# Patient Record
Sex: Male | Born: 1975
Health system: Southern US, Community
[De-identification: ages and names within clinical notes are randomized; demographics above are authoritative.]

## PROBLEM LIST (undated history)

## (undated) DIAGNOSIS — B019 Varicella without complication: Secondary | ICD-10-CM

## (undated) DIAGNOSIS — R7989 Other specified abnormal findings of blood chemistry: Secondary | ICD-10-CM

## (undated) HISTORY — DX: Varicella without complication: B01.9

## (undated) HISTORY — DX: Other specified abnormal findings of blood chemistry: R79.89

---

## 2008-01-08 ENCOUNTER — Ambulatory Visit: Payer: Self-pay | Admitting: Internal Medicine

## 2008-01-08 DIAGNOSIS — Z91013 Allergy to seafood: Secondary | ICD-10-CM

## 2008-01-08 DIAGNOSIS — J309 Allergic rhinitis, unspecified: Secondary | ICD-10-CM | POA: Insufficient documentation

## 2008-01-08 LAB — CONVERTED CEMR LAB
ALT: 25 units/L (ref 0–53)
AST: 30 units/L (ref 0–37)
Albumin: 4.5 g/dL (ref 3.5–5.2)
Alkaline Phosphatase: 37 units/L — ABNORMAL LOW (ref 39–117)
BUN: 9 mg/dL (ref 6–23)
Bacteria, UA: NEGATIVE
Basophils Relative: 3.7 % — ABNORMAL HIGH (ref 0.0–3.0)
CO2: 30 meq/L (ref 19–32)
Chloride: 105 meq/L (ref 96–112)
Crystals: NEGATIVE
Eosinophils Absolute: 0.1 10*3/uL (ref 0.0–0.7)
Eosinophils Relative: 4.1 % (ref 0.0–5.0)
GFR calc Af Amer: 89 mL/min
Glucose, Bld: 71 mg/dL (ref 70–99)
HCT: 45 % (ref 39.0–52.0)
Ketones, ur: NEGATIVE mg/dL
MCV: 94.6 fL (ref 78.0–100.0)
Monocytes Absolute: 0.3 10*3/uL (ref 0.1–1.0)
Potassium: 4.4 meq/L (ref 3.5–5.1)
RBC / HPF: NONE SEEN
RBC: 4.76 M/uL (ref 4.22–5.81)
Specific Gravity, Urine: 1.02 (ref 1.000–1.03)
Total Protein: 7.4 g/dL (ref 6.0–8.3)
Urine Glucose: NEGATIVE mg/dL
Urobilinogen, UA: 0.2 (ref 0.0–1.0)
WBC: 3.1 10*3/uL — ABNORMAL LOW (ref 4.5–10.5)

## 2008-01-09 ENCOUNTER — Encounter: Payer: Self-pay | Admitting: Internal Medicine

## 2008-03-18 ENCOUNTER — Ambulatory Visit: Payer: Self-pay | Admitting: Internal Medicine

## 2008-03-18 DIAGNOSIS — J018 Other acute sinusitis: Secondary | ICD-10-CM

## 2008-12-07 ENCOUNTER — Ambulatory Visit: Payer: Self-pay | Admitting: Internal Medicine

## 2008-12-07 DIAGNOSIS — N529 Male erectile dysfunction, unspecified: Secondary | ICD-10-CM | POA: Insufficient documentation

## 2008-12-07 LAB — CONVERTED CEMR LAB
ALT: 26 units/L (ref 0–53)
AST: 26 units/L (ref 0–37)
Basophils Absolute: 0.1 10*3/uL (ref 0.0–0.1)
Basophils Relative: 2 % — ABNORMAL HIGH (ref 0–1)
Bilirubin, Direct: 0.1 mg/dL (ref 0.0–0.3)
Calcium: 9.7 mg/dL (ref 8.4–10.5)
Cholesterol: 193 mg/dL (ref 0–200)
Indirect Bilirubin: 0.6 mg/dL (ref 0.0–0.9)
Lymphocytes Relative: 51 % — ABNORMAL HIGH (ref 12–46)
MCHC: 34.5 g/dL (ref 30.0–36.0)
Neutro Abs: 1.4 10*3/uL — ABNORMAL LOW (ref 1.7–7.7)
Neutrophils Relative %: 33 % — ABNORMAL LOW (ref 43–77)
Platelets: 257 10*3/uL (ref 150–400)
Potassium: 4.3 meq/L (ref 3.5–5.3)
RDW: 13.4 % (ref 11.5–15.5)
Sodium: 141 meq/L (ref 135–145)
TSH: 1.316 microintl units/mL (ref 0.350–4.500)
Testosterone: 365.94 ng/dL (ref 350–890)
Total CHOL/HDL Ratio: 3.7
Total Protein: 7.3 g/dL (ref 6.0–8.3)
Triglycerides: 139 mg/dL (ref <150)
VLDL: 28 mg/dL (ref 0–40)

## 2008-12-08 ENCOUNTER — Telehealth: Payer: Self-pay | Admitting: Internal Medicine

## 2008-12-17 ENCOUNTER — Encounter: Payer: Self-pay | Admitting: Internal Medicine

## 2009-05-06 ENCOUNTER — Ambulatory Visit: Payer: Self-pay | Admitting: Internal Medicine

## 2009-05-06 DIAGNOSIS — E291 Testicular hypofunction: Secondary | ICD-10-CM | POA: Insufficient documentation

## 2009-05-06 LAB — CONVERTED CEMR LAB
Basophils Absolute: 0 10*3/uL (ref 0.0–0.1)
Basophils Relative: 1 % (ref 0–1)
Free T4: 1.08 ng/dL (ref 0.80–1.80)
Hemoglobin: 15.7 g/dL (ref 13.0–17.0)
Lymphocytes Relative: 53 % — ABNORMAL HIGH (ref 12–46)
MCHC: 33.8 g/dL (ref 30.0–36.0)
Neutro Abs: 1 10*3/uL — ABNORMAL LOW (ref 1.7–7.7)
Neutrophils Relative %: 34 % — ABNORMAL LOW (ref 43–77)
Prolactin: 4.4 ng/mL (ref 2.1–17.1)
RDW: 13.2 % (ref 11.5–15.5)
Saturation Ratios: 38 % (ref 20–55)
Sex Hormone Binding: 22 nmol/L (ref 13–71)
TIBC: 352 ug/dL (ref 215–435)
TSH: 0.992 microintl units/mL (ref 0.350–4.500)
Testosterone Free: 99.9 pg/mL (ref 47.0–244.0)
Testosterone-% Free: 2.5 % (ref 1.6–2.9)

## 2009-05-11 ENCOUNTER — Encounter (INDEPENDENT_AMBULATORY_CARE_PROVIDER_SITE_OTHER): Payer: Self-pay | Admitting: *Deleted

## 2009-06-16 ENCOUNTER — Encounter: Payer: Self-pay | Admitting: Internal Medicine

## 2010-03-08 ENCOUNTER — Ambulatory Visit: Payer: Self-pay | Admitting: Internal Medicine

## 2010-03-08 DIAGNOSIS — D72819 Decreased white blood cell count, unspecified: Secondary | ICD-10-CM | POA: Insufficient documentation

## 2010-03-08 DIAGNOSIS — S335XXA Sprain of ligaments of lumbar spine, initial encounter: Secondary | ICD-10-CM

## 2010-03-08 LAB — CONVERTED CEMR LAB
Basophils Absolute: 0 10*3/uL (ref 0.0–0.1)
Eosinophils Absolute: 0.1 10*3/uL (ref 0.0–0.7)
Eosinophils Relative: 3 % (ref 0–5)
HCT: 47.3 % (ref 39.0–52.0)
HIV: NONREACTIVE
Hemoglobin: 16.2 g/dL (ref 13.0–17.0)
Lymphocytes Relative: 50 % — ABNORMAL HIGH (ref 12–46)
MCHC: 34.2 g/dL (ref 30.0–36.0)
MCV: 93.5 fL (ref 78.0–100.0)
Monocytes Absolute: 0.4 10*3/uL (ref 0.1–1.0)
Platelets: 281 10*3/uL (ref 150–400)
RDW: 13.8 % (ref 11.5–15.5)

## 2010-03-09 ENCOUNTER — Telehealth: Payer: Self-pay | Admitting: Internal Medicine

## 2010-05-30 NOTE — Letter (Signed)
Summary: Primary Care Consult Scheduled Letter  [place] [facility]  [address]   [place], [place] [address]   Phone: [phone]  Fax: [phone]      05/11/2009 MRN: [id number]  Ray Lopez [address] [place], [place]  [address]    Dear [name],      We have scheduled an appointment for you.  At the recommendation of Dr.Yoo, we have scheduled you a consult with Dr Talmage Nap , [facility] on February 17,2011  at 1:30pm .  Their address is [address] , [address] The office phone number is [phone].  If this appointment day and time is not convenient for you, please feel free to call the office of the doctor you are being referred to at the number listed above and reschedule the appointment.     It is important for you to keep your scheduled appointments. We are here to make sure you are given good patient care. If you have questions or you have made changes to your appointment, please notify us at  [phone], ask for [facility].    Thank you,  Patient Care Coordinator [place] at [facility]

## 2010-05-30 NOTE — Consult Note (Signed)
Summary: [facility]   Imported By: Lanelle Bal 07/19/2009 08:48:52  _____________________________________________________________________  External Attachment:    Type:   Image     Comment:   External Document

## 2010-05-30 NOTE — Assessment & Plan Note (Signed)
Summary: CPX/HEA   Vital Signs:  Patient profile:   35 year old male Height:      69 inches Weight:      199.75 pounds BMI:     29.60 O2 Sat:      99 % on Room air Temp:     98.3 degrees F oral Pulse rate:   65 / minute Pulse rhythm:   regular Resp:     18 per minute BP sitting:   108 / 80  (right arm) Cuff size:   large  Vitals Entered By: Glendell Docker CMA (March 08, 2010 8:43 AM)  O2 Flow:  Room air CC: CPX Is Patient Diabetic? No Pain Assessment Patient in pain? no      Comments CPX, fasting for blood work, refill on Valtrex to [facility]   Primary Care Provider:  D. Thomos Lemons DO  CC:  CPX.  History of Present Illness: 35 y/o male for routine cpx lost wt exercising more several weeks ago - rode in bicycle event - 50 miles ever since then right low back is stiff and tight no radiation  no weakness  hypogonadism using androgel feeling better libido and ED better  Preventive Screening-Counseling & Management  Alcohol-Tobacco     Alcohol drinks/day: 1     Smoking Status: never  Caffeine-Diet-Exercise     Caffeine use/day: 2 beverages daily     Does Patient Exercise: yes     Times/week: <3  Allergies: 1)  ! * Shell Fish  Past History:  Past Medical History: Genital Herpes (diagnosed 10 yrs ago) Allergic Rhinitis Remote hx of asthma     Hx of elevated BP readings w/o dx of Htn     Social History: Occupation:  Production designer, theatre/television/film for [employer]  Married - 12 yrs 44 old daughter Never Smoked Alcohol use-yes (social)      Caffeine use/day:  2 beverages daily  Review of Systems  The patient denies anorexia, weight gain, chest pain, syncope, dyspnea on exertion, prolonged cough, abdominal pain, melena, hematochezia, severe indigestion/heartburn, and depression.    Physical Exam  General:  alert, well-developed, and well-nourished.   Head:  normocephalic and atraumatic.   Eyes:  pupils equal, pupils round, and pupils reactive to light.  mild  conjunctival injection Mouth:  good dentition and pharynx pink and moist.   Neck:  supple, no masses, and no carotid bruits.   Lungs:  normal respiratory effort and normal breath sounds.   Heart:  normal rate, regular rhythm, and no gallop.   Abdomen:  soft, non-tender, normal bowel sounds, no masses, and no inguinal hernia.   Extremities:  No lower extremity edema Neurologic:  cranial nerves II-XII intact and gait normal.   Skin:  turgor normal and color normal.   Psych:  normally interactive, good eye contact, not anxious appearing, and not depressed appearing.     Impression & Recommendations:  Problem # 1:  HEALTH MAINTENANCE EXAM (ICD-V70.0) Reviewed adult health maintenance protocols.  Td Booster: given (01/15/2006)   Flu Vax: Declined (03/08/2010)   Chol: 193 (12/07/2008)   HDL: 52 (12/07/2008)   LDL: 113 (12/07/2008)   TG: 139 (12/07/2008) TSH: 0.992 (05/06/2009)     Problem # 2:  HYPOGONADISM (ICD-257.2) Assessment: Improved  Problem # 3:  LEUKOPENIA, MILD (ICD-288.50)  Orders: T-CBC w/Diff ([id number]) T-Hepatitis B Surface Antibody ([id number]) T-Hepatitis B Surface Antigen (44010-27253) T-Hepatitis C Antibody (66440-34742) T-HIV Antibody  (Reflex) ([id number])  Problem # 4:  LUMBAR STRAIN (ICD-847.2) use muscle  relaxers as directed Patient advised to call office if symptoms persist or worsen.  Complete Medication List: 1)  Valacyclovir Hcl 1 Gm Tabs (Valacyclovir hcl) .... One by mouth qd 2)  Fluticasone Propionate 50 Mcg/act Susp (Fluticasone propionate) .... 2 sprays each nostril qd 3)  Epipen 0.3 Mg/0.28ml (1:1000) Devi (Epinephrine hcl (anaphylaxis)) .... Use as directed 4)  Proair Hfa 108 (90 Base) Mcg/act Aers (Albuterol sulfate) .... 2 puffs q 6 hrs as needed 5)  Fish Oil Oil (Fish oil) .... 3 capsules by mouth once daily 6)  Androgel Pump 1.25 Gm/act (1%) Gel (Testosterone) .... Apply four times a day to skin as directed 7)  Cyclobenzaprine Hcl 5 Mg  Tabs (Cyclobenzaprine hcl) .... One by mouth two times a day prn  Patient Instructions: 1)  Please schedule a follow-up appointment in 1 year. 2)  Return office visit needed earlier if back pain does not improve Prescriptions: CYCLOBENZAPRINE HCL 5 MG TABS (CYCLOBENZAPRINE HCL) one by mouth two times a day prn  #30 x 0   Entered and Authorized by:   D. Thomos Lemons DO   Signed by:   D. Thomos Lemons DO on 03/08/2010   Method used:   Electronically to        [facility] (retail)       [address]       [place], [place]  [address]       Ph: [phone]       Fax: [phone]   RxID:   [id number]    Orders Added: 1)  T-CBC w/Diff [[id number]] 2)  T-Hepatitis B Surface Antibody [[id number]] 3)  T-Hepatitis B Surface Antigen [63875-64332] 4)  T-Hepatitis C Antibody [95188-41660] 5)  T-HIV Antibody  (Reflex) [[id number]] 6)  Est. Patient age 61-39 [63] 7)  Est. Patient Level II [99212]   Immunization History:  Influenza Immunization History:    Influenza:  declined (03/08/2010)   Contraindications/Deferment of Procedures/Staging:    Test/Procedure: FLU VAX    Reason for deferment: patient declined   Immunization History:  Influenza Immunization History:    Influenza:  Declined (03/08/2010)  Current Allergies (reviewed today): ! * SHELL FISH

## 2010-05-30 NOTE — Progress Notes (Signed)
Summary: Lab Results  Phone Note Outgoing Call   Summary of Call: call pt - HIV test negative. CBC stable. chronic  infectious hepatitis blood tests - negative Initial call taken by: D. Thomos Lemons DO,  March 09, 2010 9:05 AM  Follow-up for Phone Call        call  placed to patient at [phone], he has been advised per Dr Artist Pais instructions Follow-up by: Glendell Docker CMA,  March 09, 2010 10:39 AM

## 2010-05-30 NOTE — Assessment & Plan Note (Signed)
Summary: 4 month follow up/mhf   Vital Signs:  Patient profile:   35 year old male Weight:      217.25 pounds BMI:     32.20 O2 Sat:      100 % on Room air Temp:     98.2 degrees F oral Pulse rate:   60 / minute Pulse rhythm:   regular Resp:     16 per minute BP sitting:   110 / 70  (left arm) Cuff size:   large  Vitals Entered By: Glendell Docker CMA (May 06, 2009 9:22 AM)  O2 Flow:  Room air  Primary Care Provider:  D. Thomos Lemons DO  CC:  4 Month Follow up .  History of Present Illness: 4 Month Follow up   35 y/o AA male for follow up.   He is still having issues with decreased libido and poor erection quality.  prev labs showed low normal testosterone.   no hx testicular trauma or infection.  no fam hx of iron overload.  no OTC meds.  no headache   Preventive Screening-Counseling & Management  Alcohol-Tobacco     Smoking Status: never  Allergies: 1)  ! * Shell Fish  Past History:  Past Medical History: Genital Herpes (diagnosed 10 yrs ago) Allergic Rhinitis Remote hx of asthma    Hx of elevated BP readings w/o dx of Htn    Family History: CAD - no Stoke - no DM - MGM Htn - M Hyperlipidemia - no Breast Ca - no Colon Ca - no Prostate Ca - no      Social History: Occupation:  Production designer, theatre/television/film for [employer]  Married Never Smoked Alcohol use-yes (social)       Physical Exam  General:  alert, well-developed, and well-nourished.   Lungs:  normal respiratory effort and normal breath sounds.   Heart:  normal rate, regular rhythm, and no gallop.   Abdomen:  soft, non-tender, normal bowel sounds, no masses, and no inguinal hernia.   Genitalia:  circumcised, no scrotal masses, no testicular masses or atrophy, and no cutaneous lesions.     Impression & Recommendations:  Problem # 1:  HYPOGONADISM (ICD-257.2) Otherwise healthy 35 y/o AA male c/o decreased libido and poor erection quality.  prev testosterone level was low normal.  refer to endo for  further eval.  Orders: T-Iron [phone]) T-Iron Binding Capacity (TIBC) ([address]) T-Ferritin (82956-21308) T-HIV Antibody  (Reflex) [id number]) T-TSH (52841-32440) T-T4, Free [phone]) T- * Misc. Laboratory test [phone]) T- * Misc. Laboratory test [phone]) T-CBC w/Diff [id number]) Endocrinology Referral (Endocrine)  Complete Medication List: 1)  Valacyclovir Hcl 1 Gm Tabs (Valacyclovir hcl) .... One by mouth qd 2)  Fluticasone Propionate 50 Mcg/act Susp (Fluticasone propionate) .... 2 sprays each nostril qd 3)  Epipen 0.3 Mg/0.12ml (1:1000) Devi (Epinephrine hcl (anaphylaxis)) .... Use as directed 4)  Proair Hfa 108 (90 Base) Mcg/act Aers (Albuterol sulfate) .... 2 puffs q 6 hrs as needed 5)  Fish Oil Oil (Fish oil) .... 3 capsules by mouth once daily  Patient Instructions: 1)  We will contact you re:  referral to endocrinologist.  Current Allergies (reviewed today): ! * SHELL FISH   Immunization History:  Influenza Immunization History:    Influenza:  declined (05/06/2009)   Contraindications/Deferment of Procedures/Staging:    Test/Procedure: FLU VAX    Reason for deferment: patient declined

## 2010-10-29 HISTORY — PX: VASECTOMY: SHX75

## 2011-04-03 ENCOUNTER — Encounter: Payer: Self-pay | Admitting: Internal Medicine

## 2011-04-03 ENCOUNTER — Ambulatory Visit (INDEPENDENT_AMBULATORY_CARE_PROVIDER_SITE_OTHER): Payer: 59 | Admitting: Internal Medicine

## 2011-04-03 VITALS — BP 120/90 | HR 60 | Temp 98.4°F | Resp 16 | Ht 69.0 in | Wt 205.0 lb

## 2011-04-03 DIAGNOSIS — Z0000 Encounter for general adult medical examination without abnormal findings: Secondary | ICD-10-CM

## 2011-04-03 NOTE — Patient Instructions (Signed)
Please schedule cbc, chem7, lipid, lft, tsh, ua v70.0 prior to next visit

## 2011-04-03 NOTE — Progress Notes (Signed)
  Subjective:    Patient ID: Ray Lopez, male    DOB: 1976/01/05, 35 y.o.   MRN: [id number]  HPI Pt presents to clinic for annual exam. No complaints. Reviewed past h/o mild leukopenia without recurrent infxn. BP minimally elevated with no formal h/o HTN.   Past Medical History  Diagnosis Date  . Genital herpes   . Allergic rhinitis   . Asthma   . Blood pressure elevated without history of HTN    No past surgical history on file.  reports that he has never smoked. He has never used smokeless tobacco. He reports that he drinks alcohol. He reports that he does not use illicit drugs. family history includes Diabetes in his maternal grandmother; Hyperlipidemia in his mother; and Hypertension in his mother.  There is no history of Coronary artery disease, and Stroke, and Breast cancer, and Colon cancer, and Prostate cancer, . Not on File     Review of Systems see hpi     Objective:   Physical Exam  Nursing note and vitals reviewed. Constitutional: He appears well-developed and well-nourished. No distress.  HENT:  Head: Normocephalic and atraumatic.  Right Ear: Tympanic membrane, external ear and ear canal normal.  Left Ear: Tympanic membrane, external ear and ear canal normal.  Nose: Nose normal.  Mouth/Throat: Oropharynx is clear and moist. No oropharyngeal exudate.  Eyes: Conjunctivae and EOM are normal. Pupils are equal, round, and reactive to light. Right eye exhibits no discharge. Left eye exhibits no discharge. No scleral icterus.  Neck: Neck supple. Carotid bruit is not present. No mass and no thyromegaly present.  Cardiovascular: Normal rate, regular rhythm, normal heart sounds and intact distal pulses.  Exam reveals no gallop and no friction rub.   No murmur heard. Pulmonary/Chest: Effort normal and breath sounds normal. No respiratory distress. He has no wheezes. He has no rales.  Abdominal: Soft. Normal appearance and bowel sounds are normal. He exhibits no  distension and no mass. There is no hepatosplenomegaly. There is no tenderness. There is no rebound and no guarding.  Lymphadenopathy:    He has no cervical adenopathy.  Neurological: He is alert.  Skin: Skin is warm and dry. He is not diaphoretic.  Psychiatric: He has a normal mood and affect.          Assessment & Plan:

## 2011-04-04 LAB — BASIC METABOLIC PANEL
BUN: 12 mg/dL (ref 6–23)
Chloride: 103 mEq/L (ref 96–112)
Glucose, Bld: 85 mg/dL (ref 70–99)
Potassium: 4.4 mEq/L (ref 3.5–5.3)

## 2011-04-04 LAB — URINALYSIS, ROUTINE W REFLEX MICROSCOPIC
Hgb urine dipstick: NEGATIVE
Ketones, ur: NEGATIVE mg/dL
Nitrite: NEGATIVE
Protein, ur: NEGATIVE mg/dL
Specific Gravity, Urine: 1.011 (ref 1.005–1.030)
Urobilinogen, UA: 0.2 mg/dL (ref 0.0–1.0)

## 2011-04-04 LAB — HEPATIC FUNCTION PANEL
ALT: 26 U/L (ref 0–53)
Albumin: 4.7 g/dL (ref 3.5–5.2)
Bilirubin, Direct: 0.1 mg/dL (ref 0.0–0.3)
Indirect Bilirubin: 0.6 mg/dL (ref 0.0–0.9)
Total Protein: 7.3 g/dL (ref 6.0–8.3)

## 2011-04-04 LAB — CBC WITH DIFFERENTIAL/PLATELET
Basophils Relative: 1 % (ref 0–1)
Eosinophils Relative: 2 % (ref 0–5)
HCT: 45.1 % (ref 39.0–52.0)
Hemoglobin: 15 g/dL (ref 13.0–17.0)
Lymphocytes Relative: 47 % — ABNORMAL HIGH (ref 12–46)
MCHC: 33.3 g/dL (ref 30.0–36.0)
MCV: 93.2 fL (ref 78.0–100.0)
Monocytes Absolute: 0.3 10*3/uL (ref 0.1–1.0)
Monocytes Relative: 10 % (ref 3–12)
Neutro Abs: 1.4 10*3/uL — ABNORMAL LOW (ref 1.7–7.7)

## 2011-04-04 LAB — LIPID PANEL
LDL Cholesterol: 125 mg/dL — ABNORMAL HIGH (ref 0–99)
VLDL: 27 mg/dL (ref 0–40)

## 2011-04-04 LAB — TSH: TSH: 1.419 u[IU]/mL (ref 0.350–4.500)

## 2011-04-07 DIAGNOSIS — Z0000 Encounter for general adult medical examination without abnormal findings: Secondary | ICD-10-CM | POA: Insufficient documentation

## 2011-04-07 NOTE — Assessment & Plan Note (Signed)
Nl exam. Obtain cpe screening labs. Recommend outpt bp log to be reviewed

## 2011-07-30 HISTORY — PX: REFRACTIVE SURGERY: SHX103

## 2012-04-07 ENCOUNTER — Ambulatory Visit (INDEPENDENT_AMBULATORY_CARE_PROVIDER_SITE_OTHER): Payer: 59 | Admitting: Internal Medicine

## 2012-04-07 ENCOUNTER — Encounter: Payer: Self-pay | Admitting: Internal Medicine

## 2012-04-07 VITALS — BP 112/68 | HR 70 | Temp 98.4°F | Resp 12 | Ht 69.75 in | Wt 215.1 lb

## 2012-04-07 DIAGNOSIS — Z0000 Encounter for general adult medical examination without abnormal findings: Secondary | ICD-10-CM

## 2012-04-07 MED ORDER — EPINEPHRINE 0.3 MG/0.3ML IJ DEVI: 0.3000 mg | Freq: Once | INTRAMUSCULAR | Status: DC

## 2012-04-07 MED ORDER — ALBUTEROL SULFATE HFA 108 (90 BASE) MCG/ACT IN AERS: 2.0000 | INHALATION_SPRAY | Freq: Four times a day (QID) | RESPIRATORY_TRACT | Status: DC | PRN

## 2012-04-07 NOTE — Assessment & Plan Note (Signed)
Nl exam. Obtain cpe labs. 

## 2012-04-07 NOTE — Progress Notes (Signed)
  Subjective:    Patient ID: Ray Lopez, male    DOB: 07-13-1975, 36 y.o.   MRN: [id number]  HPI Pt presents to clinic for annual exam. No active complaints. Needs refill of epipen but has not had to use the medication recently. Declines influenza vaccine.  Past Medical History  Diagnosis Date  . Genital herpes   . Allergic rhinitis   . Asthma   . Blood pressure elevated without history of HTN    Past Surgical History  Procedure Date  . Vasectomy 10/2100  . Refractive surgery 4/13    lasix eye surgery    reports that he has never smoked. He has never used smokeless tobacco. He reports that he drinks alcohol. He reports that he does not use illicit drugs. family history includes Diabetes in his maternal grandmother; Hyperlipidemia in his mother; and Hypertension in his mother.  There is no history of Coronary artery disease, and Stroke, and Breast cancer, and Colon cancer, and Prostate cancer, . Not on File   Review of Systems see hpi     Objective:   Physical Exam  Physical Exam  Nursing note and vitals reviewed. Constitutional: He appears well-developed and well-nourished. No distress.  HENT:  Head: Normocephalic and atraumatic.  Right Ear: Tympanic membrane and external ear normal.  Left Ear: Tympanic membrane and external ear normal.  Nose: Nose normal.  Mouth/Throat: Uvula is midline, oropharynx is clear and moist and mucous membranes are normal. No oropharyngeal exudate.  Eyes: Conjunctivae and EOM are normal. Pupils are equal, round, and reactive to light. Right eye exhibits no discharge. Left eye exhibits no discharge. No scleral icterus.  Neck: Neck supple. Carotid bruit is not present. No thyromegaly present.  Cardiovascular: Normal rate, regular rhythm and normal heart sounds.  Exam reveals no gallop and no friction rub.   No murmur heard. Pulmonary/Chest: Effort normal and breath sounds normal. No respiratory distress. He has no wheezes. He has no rales.   Abdominal: Soft. He exhibits no distension and no mass. There is no hepatosplenomegaly. There is no tenderness. There is no rebound. Hernia confirmed negative in the right inguinal area and confirmed negative in the left inguinal area.  Lymphadenopathy:    He has no cervical adenopathy.   Neurological: He is alert.  Skin: Skin is warm and dry. He is not diaphoretic.  Psychiatric: He has a normal mood and affect.        Assessment & Plan:

## 2013-09-24 ENCOUNTER — Encounter: Payer: Self-pay | Admitting: Physician Assistant

## 2013-09-24 ENCOUNTER — Ambulatory Visit (INDEPENDENT_AMBULATORY_CARE_PROVIDER_SITE_OTHER): Payer: 59 | Admitting: Physician Assistant

## 2013-09-24 VITALS — BP 108/82 | HR 72 | Temp 98.3°F | Resp 16 | Ht 69.0 in | Wt 214.2 lb

## 2013-09-24 DIAGNOSIS — E291 Testicular hypofunction: Secondary | ICD-10-CM

## 2013-09-24 DIAGNOSIS — R7989 Other specified abnormal findings of blood chemistry: Secondary | ICD-10-CM

## 2013-09-24 DIAGNOSIS — N529 Male erectile dysfunction, unspecified: Secondary | ICD-10-CM

## 2013-09-24 DIAGNOSIS — Z0000 Encounter for general adult medical examination without abnormal findings: Secondary | ICD-10-CM

## 2013-09-24 LAB — CBC WITH DIFFERENTIAL/PLATELET
BASOS ABS: 0 10*3/uL (ref 0.0–0.1)
Basophils Relative: 1 % (ref 0–1)
Eosinophils Absolute: 0.1 10*3/uL (ref 0.0–0.7)
Eosinophils Relative: 3 % (ref 0–5)
HCT: 42.4 % (ref 39.0–52.0)
Hemoglobin: 14.7 g/dL (ref 13.0–17.0)
Lymphocytes Relative: 44 % (ref 12–46)
Lymphs Abs: 1.5 10*3/uL (ref 0.7–4.0)
MCH: 30.9 pg (ref 26.0–34.0)
MCHC: 34.7 g/dL (ref 30.0–36.0)
MCV: 89.1 fL (ref 78.0–100.0)
Monocytes Absolute: 0.3 10*3/uL (ref 0.1–1.0)
Monocytes Relative: 8 % (ref 3–12)
Neutro Abs: 1.5 10*3/uL — ABNORMAL LOW (ref 1.7–7.7)
Neutrophils Relative %: 44 % (ref 43–77)
PLATELETS: 255 10*3/uL (ref 150–400)
RBC: 4.76 MIL/uL (ref 4.22–5.81)
RDW: 14.3 % (ref 11.5–15.5)
WBC: 3.4 10*3/uL — ABNORMAL LOW (ref 4.0–10.5)

## 2013-09-24 LAB — LIPID PANEL
Cholesterol: 178 mg/dL (ref 0–200)
HDL: 54 mg/dL (ref >39)
LDL CALC: 101 mg/dL — AB (ref 0–99)
TRIGLYCERIDES: 117 mg/dL (ref <150)
Total CHOL/HDL Ratio: 3.3 Ratio
VLDL: 23 mg/dL (ref 0–40)

## 2013-09-24 LAB — BASIC METABOLIC PANEL
BUN: 9 mg/dL (ref 6–23)
CALCIUM: 9.1 mg/dL (ref 8.4–10.5)
CO2: 29 mEq/L (ref 19–32)
CREATININE: 1.15 mg/dL (ref 0.50–1.35)
Chloride: 103 mEq/L (ref 96–112)
Glucose, Bld: 89 mg/dL (ref 70–99)
Potassium: 4.2 mEq/L (ref 3.5–5.3)
Sodium: 139 mEq/L (ref 135–145)

## 2013-09-24 LAB — HEPATIC FUNCTION PANEL
ALT: 28 U/L (ref 0–53)
AST: 25 U/L (ref 0–37)
Albumin: 4.4 g/dL (ref 3.5–5.2)
Alkaline Phosphatase: 38 U/L — ABNORMAL LOW (ref 39–117)
BILIRUBIN INDIRECT: 0.6 mg/dL (ref 0.2–1.2)
Bilirubin, Direct: 0.2 mg/dL (ref 0.0–0.3)
TOTAL PROTEIN: 6.7 g/dL (ref 6.0–8.3)
Total Bilirubin: 0.8 mg/dL (ref 0.2–1.2)

## 2013-09-24 LAB — HEMOGLOBIN A1C
HEMOGLOBIN A1C: 5.7 % — AB (ref <5.7)
Mean Plasma Glucose: 117 mg/dL — ABNORMAL HIGH (ref <117)

## 2013-09-24 LAB — TSH: TSH: 1.613 u[IU]/mL (ref 0.350–4.500)

## 2013-09-24 NOTE — Progress Notes (Signed)
Patient presents to clinic today to establish care.  Patient due for complete physical.  Is fasting.  Acute Concerns: Patient has been noticing a severe decrease in libido over the past 6 months. Has also noticed gradual difficulty in achieving and maintaining an erection sufficient for erection. Has also noted low energy.  Denies hx of diabetes or hyperlipidemia.   Health Maintenance: Dental -- Up-to-date Vision -- Had [place] 2 years ago.  Immunizations -- Tetanus in 2007.  Due in 2017  Past Medical History  Diagnosis Date  . Genital herpes   . Allergic rhinitis   . Asthma   . Blood pressure elevated without history of HTN   . Chicken pox   . Low testosterone     Past Surgical History  Procedure Laterality Date  . Vasectomy  10/2010  . Refractive surgery  4/13    lasix eye surgery    Current Outpatient Prescriptions on File Prior to Visit  Medication Sig Dispense Refill  . albuterol (PROAIR HFA) 108 (90 BASE) MCG/ACT inhaler Inhale 2 puffs into the lungs every 6 (six) hours as needed.  3 Inhaler  3  . EPINEPHrine (EPIPEN) 0.3 mg/0.3 mL DEVI Inject 0.3 mLs (0.3 mg total) into the muscle once.  1 Device  11  . valACYclovir (VALTREX) 1000 MG tablet Take 1,000 mg by mouth daily as needed.        No current facility-administered medications on file prior to visit.    Allergies  Allergen Reactions  . Shellfish Allergy Anaphylaxis    Family History  Problem Relation Age of Onset  . Coronary artery disease Neg Hx   . Stroke Neg Hx   . Diabetes Maternal Grandmother   . Hypertension Mother     Living  . Hyperlipidemia Mother   . Breast cancer Neg Hx   . Colon cancer Neg Hx   . Prostate cancer Neg Hx   . Diverticulitis Mother   . Healthy Father     Living  . Diabetes Brother   . Healthy Sister   . Allergies Daughter     History   Social History  . Marital Status: Married    Spouse Name: N/A    Number of Children: N/A  . Years of Education: N/A   Occupational  History  . Not on file.   Social History Main Topics  . Smoking status: Never Smoker   . Smokeless tobacco: Never Used  . Alcohol Use: 3.6 oz/week    6 Cans of beer per week  . Drug Use: No  . Sexual Activity: Yes    Birth Control/ Protection: Surgical   Other Topics Concern  . Not on file   Social History Narrative   Freight forwarder for [employer]   Married over 42 years   67 yr old daughter         Review of Systems  Constitutional: Negative for fever and weight loss.  HENT: Negative for ear discharge, ear pain, hearing loss and tinnitus.   Eyes: Negative for blurred vision, double vision, photophobia and pain.  Respiratory: Negative for cough and shortness of breath.   Cardiovascular: Negative for chest pain and palpitations.  Gastrointestinal: Negative for heartburn, nausea, vomiting, abdominal pain, diarrhea, constipation, blood in stool and melena.  Genitourinary: Negative for dysuria, urgency, frequency, hematuria and flank pain.       Nocturia x 0-1. Has concerns about erectile function.   Neurological: Negative for dizziness, loss of consciousness and headaches.  Endo/Heme/Allergies: Positive for environmental  allergies.  Psychiatric/Behavioral: Negative for depression, suicidal ideas, hallucinations and substance abuse. The patient is not nervous/anxious and does not have insomnia.    BP 108/82  Pulse 72  Temp(Src) 98.3 F (36.8 C) (Oral)  Resp 16  Ht 5\' 9"  (1.753 m)  Wt 214 lb 4 oz (97.183 kg)  BMI 31.62 kg/m2  SpO2 98%  Physical Exam  Vitals reviewed. Constitutional: He is oriented to person, place, and time and well-developed, well-nourished, and in no distress.  HENT:  Head: Normocephalic and atraumatic.  Right Ear: External ear normal.  Left Ear: External ear normal.  Nose: Nose normal.  Mouth/Throat: Oropharynx is clear and moist. No oropharyngeal exudate.  Eyes: Conjunctivae are normal. Pupils are equal, round, and reactive to light.  Neck: Normal  range of motion. Neck supple. No thyromegaly present.  Cardiovascular: Normal rate, regular rhythm, normal heart sounds and intact distal pulses.   Pulmonary/Chest: Effort normal and breath sounds normal. No respiratory distress.  Abdominal: Soft. Bowel sounds are normal. He exhibits no distension and no mass. There is no tenderness. There is no rebound and no guarding.  Genitourinary: Testes/scrotum normal and penis normal. Penis exhibits no lesions. No discharge found.  Lymphadenopathy:    He has no cervical adenopathy.  Neurological: He is alert and oriented to person, place, and time. No cranial nerve deficit.  Skin: Skin is warm and dry. No rash noted.  Psychiatric: Affect normal.   Assessment/Plan: Visit for preventive health examination Will obtain fasting labs.  Health Maintenance up=to=date.  Erectile dysfunction Will obtain BMP, A1c, Lipid panel, PSA and Testosterone level.  Patient wishes to defer treatment until labs are in.  Low testosterone Endorses history.  Will obtain testosterone level and additional blood work.

## 2013-09-24 NOTE — Progress Notes (Signed)
Pre visit review using our clinic review tool, if applicable. No additional management support is needed unless otherwise documented below in the visit note/SLS  

## 2013-09-24 NOTE — Patient Instructions (Signed)
Please obtain labs.  I will call you with your results.  We will proceed with treatment based on lab results. Please read information below on preventive health care for adult men.  Preventive Care for Adults, Male A healthy lifestyle and preventive care can promote health and wellness. Preventive health guidelines for men include the following key practices:  A routine yearly physical is a good way to check with your health care provider about your health and preventative screening. It is a chance to share any concerns and updates on your health and to receive a thorough exam.  Visit your dentist for a routine exam and preventative care every 6 months. Brush your teeth twice a day and floss once a day. Good oral hygiene prevents tooth decay and gum disease.  The frequency of eye exams is based on your age, health, family medical history, use of contact lenses, and other factors. Follow your health care provider's recommendations for frequency of eye exams.  Eat a healthy diet. Foods such as vegetables, fruits, whole grains, low-fat dairy products, and lean protein foods contain the nutrients you need without too many calories. Decrease your intake of foods high in solid fats, added sugars, and salt. Eat the right amount of calories for you.Get information about a proper diet from your health care provider, if necessary.  Regular physical exercise is one of the most important things you can do for your health. Most adults should get at least 150 minutes of moderate-intensity exercise (any activity that increases your heart rate and causes you to sweat) each week. In addition, most adults need muscle-strengthening exercises on 2 or more days a week.  Maintain a healthy weight. The body mass index (BMI) is a screening tool to identify possible weight problems. It provides an estimate of body fat based on height and weight. Your health care provider can find your BMI and can help you achieve or maintain  a healthy weight.For adults 20 years and older:  A BMI below 18.5 is considered underweight.  A BMI of 18.5 to 24.9 is normal.  A BMI of 25 to 29.9 is considered overweight.  A BMI of 30 and above is considered obese.  Maintain normal blood lipids and cholesterol levels by exercising and minimizing your intake of saturated fat. Eat a balanced diet with plenty of fruit and vegetables. Blood tests for lipids and cholesterol should begin at age 78 and be repeated every 5 years. If your lipid or cholesterol levels are high, you are over 50, or you are at high risk for heart disease, you may need your cholesterol levels checked more frequently.Ongoing high lipid and cholesterol levels should be treated with medicines if diet and exercise are not working.  If you smoke, find out from your health care provider how to quit. If you do not use tobacco, do not start.  Lung cancer screening is recommended for adults aged 25 80 years who are at high risk for developing lung cancer because of a history of smoking. A yearly low-dose CT scan of the lungs is recommended for people who have at least a 30-pack-year history of smoking and are a current smoker or have quit within the past 15 years. A pack year of smoking is smoking an average of 1 pack of cigarettes a day for 1 year (for example: 1 pack a day for 30 years or 2 packs a day for 15 years). Yearly screening should continue until the smoker has stopped smoking for at least  15 years. Yearly screening should be stopped for people who develop a health problem that would prevent them from having lung cancer treatment.  If you choose to drink alcohol, do not have more than 2 drinks per day. One drink is considered to be 12 ounces (355 mL) of beer, 5 ounces (148 mL) of wine, or 1.5 ounces (44 mL) of liquor.  Avoid use of street drugs. Do not share needles with anyone. Ask for help if you need support or instructions about stopping the use of drugs.  High  blood pressure causes heart disease and increases the risk of stroke. Your blood pressure should be checked at least every 1 2 years. Ongoing high blood pressure should be treated with medicines, if weight loss and exercise are not effective.  If you are 78 40 years old, ask your health care provider if you should take aspirin to prevent heart disease.  Diabetes screening involves taking a blood sample to check your fasting blood sugar level. This should be done once every 3 years, after age 59, if you are within normal weight and without risk factors for diabetes. Testing should be considered at a younger age or be carried out more frequently if you are overweight and have at least 1 risk factor for diabetes.  Colorectal cancer can be detected and often prevented. Most routine colorectal cancer screening begins at the age of 2 and continues through age 34. However, your health care provider may recommend screening at an earlier age if you have risk factors for colon cancer. On a yearly basis, your health care provider may provide home test kits to check for hidden blood in the stool. Use of a small camera at the end of a tube to directly examine the colon (sigmoidoscopy or colonoscopy) can detect the earliest forms of colorectal cancer. Talk to your health care provider about this at age 44, when routine screening begins. Direct exam of the colon should be repeated every 5 10 years through age 18, unless early forms of precancerous polyps or small growths are found.  People who are at an increased risk for hepatitis B should be screened for this virus. You are considered at high risk for hepatitis B if:  You were born in a country where hepatitis B occurs often. Talk with your health care provider about which countries are considered high-risk.  Your parents were born in a high-risk country and you have not received a shot to protect against hepatitis B (hepatitis B vaccine).  You have HIV or  AIDS.  You use needles to inject street drugs.  You live with, or have sex with, someone who has hepatitis B.  You are a man who has sex with other men (MSM).  You get hemodialysis treatment.  You take certain medicines for conditions such as cancer, organ transplantation, and autoimmune conditions.  Hepatitis C blood testing is recommended for all people born from [birth year] through 1965 and any individual with known risks for hepatitis C.  Practice safe sex. Use condoms and avoid high-risk sexual practices to reduce the spread of sexually transmitted infections (STIs). STIs include gonorrhea, chlamydia, syphilis, trichomonas, herpes, HPV, and human immunodeficiency virus (HIV). Herpes, HIV, and HPV are viral illnesses that have no cure. They can result in disability, cancer, and death.  A one-time screening for abdominal aortic aneurysm (AAA) and surgical repair of large AAAs by ultrasound are recommended for men ages 61 to 64 years who are current or former smokers.  Healthy  men should no longer receive prostate-specific antigen (PSA) blood tests as part of routine cancer screening. Talk with your health care provider about prostate cancer screening.  Testicular cancer screening is not recommended for adult males who have no symptoms. Screening includes self-exam, a health care provider exam, and other screening tests. Consult with your health care provider about any symptoms you have or any concerns you have about testicular cancer.  Use sunscreen. Apply sunscreen liberally and repeatedly throughout the day. You should seek shade when your shadow is shorter than you. Protect yourself by wearing long sleeves, pants, a wide-brimmed hat, and sunglasses year round, whenever you are outdoors.  Once a month, do a whole-body skin exam, using a mirror to look at the skin on your back. Tell your health care provider about new moles, moles that have irregular borders, moles that are larger than a pencil  eraser, or moles that have changed in shape or color.  Stay current with required vaccines (immunizations).  Influenza vaccine. All adults should be immunized every year.  Tetanus, diphtheria, and acellular pertussis (Td, Tdap) vaccine. An adult who has not previously received Tdap or who does not know his vaccine status should receive 1 dose of Tdap. This initial dose should be followed by tetanus and diphtheria toxoids (Td) booster doses every 10 years. Adults with an unknown or incomplete history of completing a 3-dose immunization series with Td-containing vaccines should begin or complete a primary immunization series including a Tdap dose. Adults should receive a Td booster every 10 years.  Varicella vaccine. An adult without evidence of immunity to varicella should receive 2 doses or a second dose if he has previously received 1 dose.  Human papillomavirus (HPV) vaccine. Males aged 72 21 years who have not received the vaccine previously should receive the 3-dose series. Males aged 42 26 years may be immunized. Immunization is recommended through the age of 66 years for any male who has sex with males and did not get any or all doses earlier. Immunization is recommended for any person with an immunocompromised condition through the age of 61 years if he did not get any or all doses earlier. During the 3-dose series, the second dose should be obtained 4 8 weeks after the first dose. The third dose should be obtained 24 weeks after the first dose and 16 weeks after the second dose.  Zoster vaccine. One dose is recommended for adults aged 15 years or older unless certain conditions are present.  Measles, mumps, and rubella (MMR) vaccine. Adults born before [birth year] generally are considered immune to measles and mumps. Adults born in [birth year] or later should have 1 or more doses of MMR vaccine unless there is a contraindication to the vaccine or there is laboratory evidence of immunity to each of the three  diseases. A routine second dose of MMR vaccine should be obtained at least 28 days after the first dose for students attending postsecondary schools, health care workers, or international travelers. People who received inactivated measles vaccine or an unknown type of measles vaccine during 1963 1967 should receive 2 doses of MMR vaccine. People who received inactivated mumps vaccine or an unknown type of mumps vaccine before 1979 and are at high risk for mumps infection should consider immunization with 2 doses of MMR vaccine. Unvaccinated health care workers born before [birth year] who lack laboratory evidence of measles, mumps, or rubella immunity or laboratory confirmation of disease should consider measles and mumps immunization with 2 doses of MMR  vaccine or rubella immunization with 1 dose of MMR vaccine.  Pneumococcal 13-valent conjugate (PCV13) vaccine. When indicated, a person who is uncertain of his immunization history and has no record of immunization should receive the PCV13 vaccine. An adult aged 75 years or older who has certain medical conditions and has not been previously immunized should receive 1 dose of PCV13 vaccine. This PCV13 should be followed with a dose of pneumococcal polysaccharide (PPSV23) vaccine. The PPSV23 vaccine dose should be obtained at least 8 weeks after the dose of PCV13 vaccine. An adult aged 56 years or older who has certain medical conditions and previously received 1 or more doses of PPSV23 vaccine should receive 1 dose of PCV13. The PCV13 vaccine dose should be obtained 1 or more years after the last PPSV23 vaccine dose.  Pneumococcal polysaccharide (PPSV23) vaccine. When PCV13 is also indicated, PCV13 should be obtained first. All adults aged 17 years and older should be immunized. An adult younger than age 40 years who has certain medical conditions should be immunized. Any person who resides in a nursing home or long-term care facility should be immunized. An adult  smoker should be immunized. People with an immunocompromised condition and certain other conditions should receive both PCV13 and PPSV23 vaccines. People with human immunodeficiency virus (HIV) infection should be immunized as soon as possible after diagnosis. Immunization during chemotherapy or radiation therapy should be avoided. Routine use of PPSV23 vaccine is not recommended for American Indians, [place] Natives, or people younger than 65 years unless there are medical conditions that require PPSV23 vaccine. When indicated, people who have unknown immunization and have no record of immunization should receive PPSV23 vaccine. One-time revaccination 5 years after the first dose of PPSV23 is recommended for people aged 35 64 years who have chronic kidney failure, nephrotic syndrome, asplenia, or immunocompromised conditions. People who received 1 2 doses of PPSV23 before age 60 years should receive another dose of PPSV23 vaccine at age 31 years or later if at least 5 years have passed since the previous dose. Doses of PPSV23 are not needed for people immunized with PPSV23 at or after age 37 years.  Meningococcal vaccine. Adults with asplenia or persistent complement component deficiencies should receive 2 doses of quadrivalent meningococcal conjugate (MenACWY-D) vaccine. The doses should be obtained at least 2 months apart. Microbiologists working with certain meningococcal bacteria, [employer] recruits, people at risk during an outbreak, and people who travel to or live in countries with a high rate of meningitis should be immunized. A first-year college student up through age 25 years who is living in a residence hall should receive a dose if he did not receive a dose on or after his 16th birthday. Adults who have certain high-risk conditions should receive one or more doses of vaccine.  Hepatitis A vaccine. Adults who wish to be protected from this disease, have certain high-risk conditions, work with  hepatitis A-infected animals, work in hepatitis A research labs, or travel to or work in countries with a high rate of hepatitis A should be immunized. Adults who were previously unvaccinated and who anticipate close contact with an international adoptee during the first 60 days after arrival in the [place] from a country with a high rate of hepatitis A should be immunized.  Hepatitis B vaccine. Adults who wish to be protected from this disease, have certain high-risk conditions, may be exposed to blood or other infectious body fluids, are household contacts or sex partners of hepatitis B positive people,  are clients or workers in certain care facilities, or travel to or work in countries with a high rate of hepatitis B should be immunized.  Haemophilus influenzae type b (Hib) vaccine. A previously unvaccinated person with asplenia or sickle cell disease or having a scheduled splenectomy should receive 1 dose of Hib vaccine. Regardless of previous immunization, a recipient of a hematopoietic stem cell transplant should receive a 3-dose series 6 12 months after his successful transplant. Hib vaccine is not recommended for adults with HIV infection. Preventive Service / Frequency Ages 74 to 51  Blood pressure check.** / Every 1 to 2 years.  Lipid and cholesterol check.** / Every 5 years beginning at age 31.  Hepatitis C blood test.** / For any individual with known risks for hepatitis C.  Skin self-exam. / Monthly.  Influenza vaccine. / Every year.  Tetanus, diphtheria, and acellular pertussis (Tdap, Td) vaccine.** / Consult your health care provider. 1 dose of Td every 10 years.  Varicella vaccine.** / Consult your health care provider.  HPV vaccine. / 3 doses over 6 months, if 72 or younger.  Measles, mumps, rubella (MMR) vaccine.** / You need at least 1 dose of MMR if you were born in 1957 or later. You may also need a second dose.  Pneumococcal 13-valent conjugate (PCV13) vaccine.**  / Consult your health care provider.  Pneumococcal polysaccharide (PPSV23) vaccine.** / 1 to 2 doses if you smoke cigarettes or if you have certain conditions.  Meningococcal vaccine.** / 1 dose if you are age 21 to 35 years and a Market researcher living in a residence hall, or have one of several medical conditions. You may also need additional booster doses.  Hepatitis A vaccine.** / Consult your health care provider.  Hepatitis B vaccine.** / Consult your health care provider.  Haemophilus influenzae type b (Hib) vaccine.** / Consult your health care provider. Ages 9 to 18  Blood pressure check.** / Every 1 to 2 years.  Lipid and cholesterol check.** / Every 5 years beginning at age 42.  Lung cancer screening. / Every year if you are aged 18 80 years and have a 30-pack-year history of smoking and currently smoke or have quit within the past 15 years. Yearly screening is stopped once you have quit smoking for at least 15 years or develop a health problem that would prevent you from having lung cancer treatment.  Fecal occult blood test (FOBT) of stool. / Every year beginning at age 44 and continuing until age 25. You may not have to do this test if you get a colonoscopy every 10 years.  Flexible sigmoidoscopy** or colonoscopy.** / Every 5 years for a flexible sigmoidoscopy or every 10 years for a colonoscopy beginning at age 71 and continuing until age 54.  Hepatitis C blood test.** / For all people born from [birth year] through 1965 and any individual with known risks for hepatitis C.  Skin self-exam. / Monthly.  Influenza vaccine. / Every year.  Tetanus, diphtheria, and acellular pertussis (Tdap/Td) vaccine.** / Consult your health care provider. 1 dose of Td every 10 years.  Varicella vaccine.** / Consult your health care provider.  Zoster vaccine.** / 1 dose for adults aged 40 years or older.  Measles, mumps, rubella (MMR) vaccine.** / You need at least 1 dose of MMR if  you were born in 1959 or later. You may also need a second dose.  Pneumococcal 13-valent conjugate (PCV13) vaccine.** / Consult your health care provider.  Pneumococcal polysaccharide (PPSV23) vaccine.** /  1 to 2 doses if you smoke cigarettes or if you have certain conditions.  Meningococcal vaccine.** / Consult your health care provider.  Hepatitis A vaccine.** / Consult your health care provider.  Hepatitis B vaccine.** / Consult your health care provider.  Haemophilus influenzae type b (Hib) vaccine.** / Consult your health care provider. Ages 27 and over  Blood pressure check.** / Every 1 to 2 years.  Lipid and cholesterol check.**/ Every 5 years beginning at age 42.  Lung cancer screening. / Every year if you are aged 79 80 years and have a 30-pack-year history of smoking and currently smoke or have quit within the past 15 years. Yearly screening is stopped once you have quit smoking for at least 15 years or develop a health problem that would prevent you from having lung cancer treatment.  Fecal occult blood test (FOBT) of stool. / Every year beginning at age 20 and continuing until age 72. You may not have to do this test if you get a colonoscopy every 10 years.  Flexible sigmoidoscopy** or colonoscopy.** / Every 5 years for a flexible sigmoidoscopy or every 10 years for a colonoscopy beginning at age 20 and continuing until age 5.  Hepatitis C blood test.** / For all people born from [birth year] through 1967 and any individual with known risks for hepatitis C.  Abdominal aortic aneurysm (AAA) screening.** / A one-time screening for ages 68 to 52 years who are current or former smokers.  Skin self-exam. / Monthly.  Influenza vaccine. / Every year.  Tetanus, diphtheria, and acellular pertussis (Tdap/Td) vaccine.** / 1 dose of Td every 10 years.  Varicella vaccine.** / Consult your health care provider.  Zoster vaccine.** / 1 dose for adults aged 41 years or older.  Pneumococcal  13-valent conjugate (PCV13) vaccine.** / Consult your health care provider.  Pneumococcal polysaccharide (PPSV23) vaccine.** / 1 dose for all adults aged 33 years and older.  Meningococcal vaccine.** / Consult your health care provider.  Hepatitis A vaccine.** / Consult your health care provider.  Hepatitis B vaccine.** / Consult your health care provider.  Haemophilus influenzae type b (Hib) vaccine.** / Consult your health care provider. **Family history and personal history of risk and conditions may change your health care provider's recommendations. Document Released: 06/12/2001 Document Revised: 02/04/2013 Document Reviewed: 09/11/2010 [facility] Patient Information 2014 [place], [place].

## 2013-09-25 LAB — URINALYSIS, ROUTINE W REFLEX MICROSCOPIC
Bilirubin Urine: NEGATIVE
Glucose, UA: NEGATIVE mg/dL
Hgb urine dipstick: NEGATIVE
KETONES UR: NEGATIVE mg/dL
Leukocytes, UA: NEGATIVE
NITRITE: NEGATIVE
PROTEIN: NEGATIVE mg/dL
Specific Gravity, Urine: 1.024 (ref 1.005–1.030)
UROBILINOGEN UA: 0.2 mg/dL (ref 0.0–1.0)
pH: 6 (ref 5.0–8.0)

## 2013-09-25 LAB — TESTOSTERONE, FREE, TOTAL, SHBG
SEX HORMONE BINDING: 25 nmol/L (ref 13–71)
TESTOSTERONE FREE: 66.2 pg/mL (ref 47.0–244.0)
TESTOSTERONE: 290 ng/dL — AB (ref 300–890)
Testosterone-% Free: 2.3 % (ref 1.6–2.9)

## 2013-09-25 LAB — PSA: PSA: 0.65 ng/mL (ref <=4.00)

## 2013-09-27 DIAGNOSIS — N529 Male erectile dysfunction, unspecified: Secondary | ICD-10-CM | POA: Insufficient documentation

## 2013-09-27 DIAGNOSIS — R7989 Other specified abnormal findings of blood chemistry: Secondary | ICD-10-CM | POA: Insufficient documentation

## 2013-09-27 DIAGNOSIS — Z0000 Encounter for general adult medical examination without abnormal findings: Secondary | ICD-10-CM | POA: Insufficient documentation

## 2013-09-27 NOTE — Assessment & Plan Note (Signed)
Will obtain fasting labs.  Health Maintenance up=to=date.

## 2013-09-27 NOTE — Assessment & Plan Note (Signed)
Will obtain BMP, A1c, Lipid panel, PSA and Testosterone level.  Patient wishes to defer treatment until labs are in.

## 2013-09-27 NOTE — Assessment & Plan Note (Signed)
Endorses history.  Will obtain testosterone level and additional blood work.

## 2013-09-28 ENCOUNTER — Telehealth: Payer: Self-pay | Admitting: *Deleted

## 2013-09-28 DIAGNOSIS — R7989 Other specified abnormal findings of blood chemistry: Secondary | ICD-10-CM

## 2013-09-28 NOTE — Telephone Encounter (Signed)
Message copied by Rockwell Germany on Mon Sep 28, 2013  4:46 PM ------      Message from: Raiford Noble      Created: Fri Sep 25, 2013  1:20 PM       Labs all look good.  His testosterone is at 290.  Normal starts at 300.  I really do not feel his testosterone levels are contributing to his symptoms at present.  We can recheck testosterone level in 1 month to verify this value. ------

## 2013-09-28 NOTE — Telephone Encounter (Signed)
Patient informed, understood & agreed; new lab order placed/SLS  

## 2014-09-17 ENCOUNTER — Encounter: Payer: Self-pay | Admitting: *Deleted

## 2014-09-17 ENCOUNTER — Telehealth: Payer: Self-pay | Admitting: *Deleted

## 2014-09-17 NOTE — Telephone Encounter (Signed)
Pre-Visit Call completed with patient and chart updated.   Pre-Visit Info documented in Specialty Comments under [employer].    

## 2014-09-17 NOTE — Telephone Encounter (Signed)
Unable to reach patient at time of Pre-Visit Call.  Left message for patient to return call when available.    

## 2014-09-17 NOTE — Addendum Note (Signed)
Addended by: Leticia Penna A on: 09/17/2014 10:15 AM   Modules accepted: Medications

## 2014-09-20 ENCOUNTER — Encounter: Payer: Self-pay | Admitting: Physician Assistant

## 2014-09-21 ENCOUNTER — Encounter: Payer: Self-pay | Admitting: Physician Assistant

## 2014-09-21 ENCOUNTER — Telehealth: Payer: Self-pay | Admitting: Physician Assistant

## 2014-09-21 ENCOUNTER — Ambulatory Visit (INDEPENDENT_AMBULATORY_CARE_PROVIDER_SITE_OTHER): Payer: 59 | Admitting: Physician Assistant

## 2014-09-21 ENCOUNTER — Ambulatory Visit (HOSPITAL_BASED_OUTPATIENT_CLINIC_OR_DEPARTMENT_OTHER)
Admission: RE | Admit: 2014-09-21 | Discharge: 2014-09-21 | Disposition: A | Discharge status: Home / Self Care | Payer: 59 | Source: Ambulatory Visit | Attending: Physician Assistant | Admitting: Physician Assistant

## 2014-09-21 VITALS — BP 110/74 | HR 59 | Temp 98.1°F | Ht 69.75 in | Wt 216.6 lb

## 2014-09-21 DIAGNOSIS — N401 Enlarged prostate with lower urinary tract symptoms: Secondary | ICD-10-CM | POA: Diagnosis not present

## 2014-09-21 DIAGNOSIS — Z0000 Encounter for general adult medical examination without abnormal findings: Secondary | ICD-10-CM | POA: Diagnosis not present

## 2014-09-21 DIAGNOSIS — M79605 Pain in left leg: Secondary | ICD-10-CM

## 2014-09-21 DIAGNOSIS — N3943 Post-void dribbling: Secondary | ICD-10-CM | POA: Insufficient documentation

## 2014-09-21 DIAGNOSIS — N529 Male erectile dysfunction, unspecified: Secondary | ICD-10-CM

## 2014-09-21 DIAGNOSIS — M25562 Pain in left knee: Secondary | ICD-10-CM | POA: Insufficient documentation

## 2014-09-21 LAB — TSH: TSH: 1.48 u[IU]/mL (ref 0.35–4.50)

## 2014-09-21 LAB — HEMOGLOBIN A1C: HEMOGLOBIN A1C: 5.5 % (ref 4.6–6.5)

## 2014-09-21 LAB — COMPREHENSIVE METABOLIC PANEL
ALK PHOS: 36 U/L — AB (ref 39–117)
ALT: 33 U/L (ref 0–53)
AST: 32 U/L (ref 0–37)
Albumin: 4.4 g/dL (ref 3.5–5.2)
BUN: 12 mg/dL (ref 6–23)
CHLORIDE: 102 meq/L (ref 96–112)
CO2: 32 meq/L (ref 19–32)
Calcium: 9.6 mg/dL (ref 8.4–10.5)
Creatinine, Ser: 1.06 mg/dL (ref 0.40–1.50)
GFR: 98.83 mL/min (ref >60.00)
GLUCOSE: 88 mg/dL (ref 70–99)
Potassium: 4.3 mEq/L (ref 3.5–5.1)
Sodium: 137 mEq/L (ref 135–145)
TOTAL PROTEIN: 6.9 g/dL (ref 6.0–8.3)
Total Bilirubin: 0.8 mg/dL (ref 0.2–1.2)

## 2014-09-21 LAB — CBC
HCT: 46.1 % (ref 39.0–52.0)
HEMOGLOBIN: 15.8 g/dL (ref 13.0–17.0)
MCHC: 34.3 g/dL (ref 30.0–36.0)
MCV: 91.3 fl (ref 78.0–100.0)
Platelets: 222 10*3/uL (ref 150.0–400.0)
RBC: 5.05 Mil/uL (ref 4.22–5.81)
RDW: 14.1 % (ref 11.5–15.5)
WBC: 3.7 10*3/uL — AB (ref 4.0–10.5)

## 2014-09-21 LAB — URINALYSIS, ROUTINE W REFLEX MICROSCOPIC
BILIRUBIN URINE: NEGATIVE
Hgb urine dipstick: NEGATIVE
Ketones, ur: NEGATIVE
Leukocytes, UA: NEGATIVE
NITRITE: NEGATIVE
Specific Gravity, Urine: 1.01 (ref 1.000–1.030)
Total Protein, Urine: NEGATIVE
Urine Glucose: NEGATIVE
Urobilinogen, UA: 0.2 (ref 0.0–1.0)
WBC, UA: NONE SEEN — AB (ref 0–?)
pH: 6 (ref 5.0–8.0)

## 2014-09-21 LAB — LIPID PANEL
CHOLESTEROL: 186 mg/dL (ref 0–200)
HDL: 50.4 mg/dL (ref >39.00)
LDL CALC: 111 mg/dL — AB (ref 0–99)
NONHDL: 135.6
TRIGLYCERIDES: 122 mg/dL (ref 0.0–149.0)
Total CHOL/HDL Ratio: 4
VLDL: 24.4 mg/dL (ref 0.0–40.0)

## 2014-09-21 LAB — PSA: PSA: 1.3 ng/mL (ref 0.10–4.00)

## 2014-09-21 MED ORDER — TADALAFIL 5 MG PO TABS: 5.0000 mg | ORAL_TABLET | Freq: Every day | ORAL | Status: DC

## 2014-09-21 NOTE — Assessment & Plan Note (Signed)
With palpable prostate enlargement on exam.  Will check PSA level today for prostate cancer screening. Will begin Cialis 5 mg daily for ED and BPH.  Follow-up 1 month.

## 2014-09-21 NOTE — Patient Instructions (Signed)
Please stop by the lab for blood work. I will call you with your results. Please start the Cialis daily as directed for erectile dysfunction and enlarged prostate. Follow-up in 1 month.  Remember to stop by downstairs to get your x-ray.  If normal we should proceed with Sports medicine referral.

## 2014-09-21 NOTE — Assessment & Plan Note (Signed)
Depression screen negative.  Health Maintenance reviewed.  Up-to-date on immunizations.  Will obtain fasting labs today to include CBC, CMP, TSH, UA, Lipid Panel and A1C.

## 2014-09-21 NOTE — Progress Notes (Signed)
Patient presents to clinic today for annual exam.  Patient is fasting for labs.  Acute Concerns: Patient complains of difficulty achieving and maintaining erection significant enough for penetration.  Onset was gradual but has been worsening over the past year.  Nocturia x 1.  Denies urinary hesitancy but endorses significant post void dribbling.    Chronic Issues: Patient complains lateral left thigh and knee pain over the past year.  Denies trauma or injury. Pain is worse with walking.  Denies swelling, bruising, numbness or tingling.  Health Maintenance: Dental -- up-to-date Vision -- up-to-date Immunizations -- up-to-date  Past Medical History  Diagnosis Date  . Genital herpes   . Allergic rhinitis   . Asthma   . Blood pressure elevated without history of HTN   . Chicken pox   . Low testosterone     Past Surgical History  Procedure Laterality Date  . Vasectomy  10/2010  . Refractive surgery  4/13    lasix eye surgery    Current Outpatient Prescriptions on File Prior to Visit  Medication Sig Dispense Refill  . EPINEPHrine (EPIPEN) 0.3 mg/0.3 mL DEVI Inject 0.3 mLs (0.3 mg total) into the muscle once. 1 Device 11  . Multiple Vitamins-Minerals (MULTIVITAMIN PO) Take by mouth daily. [place] MENS MVI     No current facility-administered medications on file prior to visit.    Allergies  Allergen Reactions  . Shellfish Allergy Anaphylaxis    Family History  Problem Relation Age of Onset  . Coronary artery disease Neg Hx   . Stroke Neg Hx   . Diabetes Maternal Grandmother   . Hypertension Mother     Living  . Hyperlipidemia Mother   . Breast cancer Neg Hx   . Colon cancer Neg Hx   . Prostate cancer Neg Hx   . Diverticulitis Mother   . Healthy Father     Living  . Diabetes Brother   . Healthy Sister   . Allergies Daughter     History   Social History  . Marital Status: Married    Spouse Name: N/A  . Number of Children: N/A  . Years of Education: N/A    Occupational History  . Not on file.   Social History Main Topics  . Smoking status: Never Smoker   . Smokeless tobacco: Never Used  . Alcohol Use: 3.6 oz/week    6 Cans of beer per week  . Drug Use: No  . Sexual Activity: Yes    Birth Control/ Protection: Surgical   Other Topics Concern  . 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  HENT: Negative for ear discharge, ear pain, hearing loss and tinnitus.   Eyes: Negative for blurred vision, double vision, photophobia and pain.  Respiratory: Negative for cough and shortness of breath.   Cardiovascular: Negative for chest pain and palpitations.  Gastrointestinal: Negative for heartburn, nausea, vomiting, abdominal pain, diarrhea, constipation, blood in stool and melena.  Genitourinary: Negative for dysuria, urgency, frequency, hematuria and flank pain.  Musculoskeletal: Positive for joint pain. Negative for falls.  Neurological: Negative for dizziness, loss of consciousness and headaches.  Endo/Heme/Allergies: Negative for environmental allergies.  Psychiatric/Behavioral: Negative for depression, suicidal ideas, hallucinations and substance abuse. The patient is not nervous/anxious and does not have insomnia.  BP 110/74 mmHg  Pulse 59  Temp(Src) 98.1 F (36.7 C) (Oral)  Ht 5' 9.75" (1.772 m)  Wt 216 lb 9.6 oz (98.249 kg)  BMI 31.29 kg/m2  SpO2 100%  Physical Exam  Constitutional: He is oriented to person, place, and time and well-developed, well-nourished, and in no distress.  HENT:  Head: Normocephalic and atraumatic.  Right Ear: External ear normal.  Left Ear: External ear normal.  Nose: Nose normal.  Mouth/Throat: Oropharynx is clear and moist. No oropharyngeal exudate.  Eyes: Conjunctivae and EOM are normal. Pupils are equal, round, and reactive to light.  Neck:  Neck supple. No thyromegaly present.  Cardiovascular: Normal rate, regular rhythm, normal heart sounds and intact distal pulses.   Pulmonary/Chest: Effort normal and breath sounds normal. No respiratory distress. He has no wheezes. He has no rales. He exhibits no tenderness.  Abdominal: Soft. Bowel sounds are normal. He exhibits no distension and no mass. There is no tenderness. There is no rebound and no guarding.  Genitourinary: Testes/scrotum normal and penis normal. Prostate is enlarged. Prostate is not tender. No discharge found.  Musculoskeletal:       Right knee: Normal.       Left knee: Tenderness found.       Right ankle: Normal.       Left ankle: Normal.       Right upper leg: Normal.       Left upper leg: Normal.       Right lower leg: Normal.       Left lower leg: Normal.  Lymphadenopathy:    He has no cervical adenopathy.  Neurological: He is alert and oriented to person, place, and time.  Skin: Skin is warm and dry. No rash noted.  Psychiatric: Affect normal.  Vitals reviewed.  Assessment/Plan: Erectile dysfunction With palpable prostate enlargement on exam.  Will check PSA level today for prostate cancer screening. Will begin Cialis 5 mg daily for ED and BPH.  Follow-up 1 month.   Visit for preventive health examination Depression screen negative.  Health Maintenance reviewed.  Up-to-date on immunizations.  Will obtain fasting labs today to include CBC, CMP, TSH, UA, Lipid Panel and A1C.   Benign prostatic hyperplasia (BPH) with post-void dribbling Enlargement palpable on exam. Will check PSA.  Will begin Cialis 5 mg daily. Follow-up 1 month.   Left leg pain Unclear but suspect MSK in nature.  Giving knee symptoms will also check X-ray of knee.  If negative will refer to Sports medicine for further assessment.

## 2014-09-21 NOTE — Progress Notes (Signed)
Pre visit review using our clinic review tool, if applicable. No additional management support is needed unless otherwise documented below in the visit note. 

## 2014-09-21 NOTE — Assessment & Plan Note (Signed)
Unclear but suspect MSK in nature.  Giving knee symptoms will also check X-ray of knee.  If negative will refer to Sports medicine for further assessment.

## 2014-09-21 NOTE — Assessment & Plan Note (Signed)
Enlargement palpable on exam. Will check PSA.  Will begin Cialis 5 mg daily. Follow-up 1 month.

## 2014-09-21 NOTE — Telephone Encounter (Signed)
Referral placed.

## 2014-09-21 NOTE — Telephone Encounter (Signed)
-----   Message from Harl Bowie, [place] sent at 09/21/2014  3:13 PM EDT ----- Spoke with the pt and informed him of recent x-rays result and note.  Pt verbalized understanding and agreed to referral to Sports Medicine.//AB/CMA

## 2014-09-30 ENCOUNTER — Ambulatory Visit (INDEPENDENT_AMBULATORY_CARE_PROVIDER_SITE_OTHER): Payer: 59 | Admitting: Family Medicine

## 2014-09-30 ENCOUNTER — Ambulatory Visit (HOSPITAL_BASED_OUTPATIENT_CLINIC_OR_DEPARTMENT_OTHER)
Admission: RE | Admit: 2014-09-30 | Discharge: 2014-09-30 | Disposition: A | Discharge status: Home / Self Care | Payer: 59 | Source: Ambulatory Visit | Attending: Family Medicine | Admitting: Family Medicine

## 2014-09-30 ENCOUNTER — Encounter: Payer: Self-pay | Admitting: Family Medicine

## 2014-09-30 ENCOUNTER — Telehealth: Payer: Self-pay | Admitting: *Deleted

## 2014-09-30 VITALS — BP 119/75 | HR 57 | Ht 69.0 in | Wt 208.0 lb

## 2014-09-30 DIAGNOSIS — M79605 Pain in left leg: Secondary | ICD-10-CM | POA: Diagnosis not present

## 2014-09-30 DIAGNOSIS — M79652 Pain in left thigh: Secondary | ICD-10-CM | POA: Diagnosis not present

## 2014-09-30 NOTE — Patient Instructions (Signed)
Get x-rays of your femur as you leave today - we will call you with the results. Assuming these are normal your primary issue is vastus lateralis (part of your quad) weakness, spasms. Compression sleeve is beneficial - wear when up and walking around. Heat 15 minutes at a time 3-4 times a day. Start physical therapy (you'll likely only need to do 2-3 visits) and do home exercises on days you don't go to physical therapy for next 6 weeks. Follow up with me in 6 weeks. Activities as tolerated - generally if limping or if pain exceeds 3 on a scale of 1-10 avoid that activity.

## 2014-09-30 NOTE — Telephone Encounter (Signed)
Pt dropped off wellness form. Filled out as much as possible and forwarded to [place]. JG//CMA

## 2014-10-04 NOTE — Assessment & Plan Note (Signed)
unusual that this started over a year ago and without an injury.  He describes spasms/strain of vastus lateralis though exam relatively benign.  Is not a runner - stress fracture would be unusual and very unusual for this length of time.  Radiographs negative for a bony lesion.  Encouraged to start treatment for vastus lateralis strain/spasms - compression sleeve, heat, physical therapy.  F/u in 6 weeks.  Brief MSK u/s of area in question shows a little disorganization of muscle fibers but no obvious tear or hematoma.  No bony irregularities.

## 2014-10-04 NOTE — Progress Notes (Signed)
PCP and referred by: Leeanne Rio, PA-C  Subjective:   HPI: Patient is a 39 y.o. male here for left thigh pain.  Patient reports for about 18 months he has had left upper thigh pain. Pain worse with jogging and especially with bowling. No acute injury or trauma. No swelling or bruising. Does some stretching, exercising at least 3 times a week. Unable to reproduce the pain himself.  Past Medical History  Diagnosis Date  . Genital herpes   . Allergic rhinitis   . Asthma   . Blood pressure elevated without history of HTN   . Chicken pox   . Low testosterone     Current Outpatient Prescriptions on File Prior to Visit  Medication Sig Dispense Refill  . EPINEPHrine (EPIPEN) 0.3 mg/0.3 mL DEVI Inject 0.3 mLs (0.3 mg total) into the muscle once. 1 Device 11  . Multiple Vitamins-Minerals (MULTIVITAMIN PO) Take by mouth daily. [employer] MENS MVI    . tadalafil (CIALIS) 5 MG tablet Take 1 tablet (5 mg total) by mouth daily. 90 tablet 1   No current facility-administered medications on file prior to visit.    Past Surgical History  Procedure Laterality Date  . Vasectomy  10/2010  . Refractive surgery  4/13    lasix eye surgery    Allergies  Allergen Reactions  . Shellfish Allergy Anaphylaxis    History   Social History  . Marital Status: Married    Spouse Name: N/A  . Number of Children: N/A  . Years of Education: N/A   Occupational History  . Not on file.   Social History Main Topics  . Smoking status: Never Smoker   . Smokeless tobacco: Never Used  . Alcohol Use: 3.6 oz/week    6 Cans of beer per week  . Drug Use: No  . Sexual Activity: Yes    Birth Control/ Protection: Surgical   Other Topics Concern  . 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 Coronary artery disease Neg Hx   . Stroke Neg Hx   . Diabetes Maternal  Grandmother   . Hypertension Mother     Living  . Hyperlipidemia Mother   . Breast cancer Neg Hx   . Colon cancer Neg Hx   . Prostate cancer Neg Hx   . Diverticulitis Mother   . Healthy Father     Living  . Diabetes Brother   . Healthy Sister   . Allergies Daughter     BP 119/75 mmHg  Pulse 57  Ht 5\' 9"  (1.753 m)  Wt 208 lb (94.348 kg)  BMI 30.70 kg/m2  Review of Systems: See HPI above.    Objective:  Physical Exam:  Gen: NAD  Left hip/leg: No gross deformity, swelling, bruising. No focal tenderness to palpation currently. FROM hip and knee without pain. Strength 5/5 with hip flexion and knee extension - feels mildly with knee extension, straight leg raise. Negative logroll of hip. Negative fulcrum.    Assessment & Plan:  1. Left leg pain - unusual that this started over a year ago and without an injury.  He describes spasms/strain of vastus lateralis though exam relatively benign.  Is not a runner - stress fracture would be unusual and very unusual for this length of time.  Radiographs  negative for a bony lesion.  Encouraged to start treatment for vastus lateralis strain/spasms - compression sleeve, heat, physical therapy.  F/u in 6 weeks.  Brief MSK u/s of area in question shows a little disorganization of muscle fibers but no obvious tear or hematoma.  No bony irregularities.

## 2014-10-05 ENCOUNTER — Telehealth: Payer: Self-pay | Admitting: Physician Assistant

## 2014-10-05 MED ORDER — TADALAFIL 5 MG PO TABS: 5.0000 mg | ORAL_TABLET | Freq: Every day | ORAL | Status: DC

## 2014-10-05 NOTE — Telephone Encounter (Signed)
Caller name: nahshon Relation to pt: self Call back number: [phone] Pharmacy:  Reason for call:   Patient thinks that insurance will not cover cialis. He wants to know if he can get a written rx from [facility] and he will shop around to see who has the best price.

## 2014-10-05 NOTE — Telephone Encounter (Signed)
Please advise.//AB/CMA 

## 2014-10-05 NOTE — Telephone Encounter (Signed)
Please let patient know that prescription is at front desk for pickup.

## 2014-10-06 NOTE — Telephone Encounter (Signed)
Called and spoke with the pt and informed him of the note below.  Pt agreed.//AB/CMA

## 2014-10-20 ENCOUNTER — Telehealth: Payer: Self-pay | Admitting: Physician Assistant

## 2014-10-20 MED ORDER — NAPROXEN 500 MG PO TABS: 500.0000 mg | ORAL_TABLET | Freq: Two times a day (BID) | ORAL | Status: DC

## 2014-10-20 NOTE — Telephone Encounter (Signed)
Caller name:  Erle Guster Relationship to patient: self Can be reached:[phone] Pharmacy: [facility]  Reason for call: Pt is taking over the counter ibuprofen for his knee pain but is taking 3-4 at a time several times a day. Can he get a prescription ibuprofen or anti-inflammatory for knee pain? Pt states he discussed knee in May with you.

## 2014-10-20 NOTE — Telephone Encounter (Signed)
Left msg for pt regarding medication sent to pharmacy and to call if any questions.

## 2014-10-20 NOTE — Telephone Encounter (Signed)
Rx Naprosyn sent to pharmacy to take twice daily.

## 2015-01-20 ENCOUNTER — Telehealth: Payer: Self-pay | Admitting: Physician Assistant

## 2015-01-20 NOTE — Telephone Encounter (Signed)
[facility] faxed forms for pt to [phone] several days ago and have not received return. Pt was provided [phone] to have them refax either 9/22 or 9/23. Please notify pt when these are received!!! Pt ph # [phone].

## 2015-01-21 NOTE — Telephone Encounter (Signed)
Have not been received by myself or Ivin Booty. Can we please call patient to get a number where we can request the forms ourselves? What type of forms are they?

## 2015-01-21 NOTE — Telephone Encounter (Signed)
Cody/Sharon: have either of you seen these forms? I have been through all my stuff and do not see them. Thanks, JG//CMA

## 2015-01-21 NOTE — Telephone Encounter (Signed)
Called and spoke with pt. He states he reached out to [facility] yesterday afternoon and asked them to please re-fax them to our back fax at [phone]. I will be on the lookout for them today. JG//CMA

## 2015-08-08 ENCOUNTER — Telehealth: Payer: Self-pay | Admitting: *Deleted

## 2015-08-08 NOTE — Telephone Encounter (Signed)
Received request for PA for Cialis; patient last seen 09/21/14, was due back in for F/U visit in 1 month. Patient must have follow-up office visit prior to any refills and/or PA/SLS 04/10 Please Advise patient and schedule F/U appointment. Thanks

## 2015-08-08 NOTE — Telephone Encounter (Signed)
Scheduled for 09/12/15

## 2015-08-09 NOTE — Telephone Encounter (Signed)
PA process initiated for Cialis on [email or web address]. Awaiting determination. JG//CMA

## 2015-09-12 ENCOUNTER — Ambulatory Visit (INDEPENDENT_AMBULATORY_CARE_PROVIDER_SITE_OTHER): Payer: BLUE CROSS/BLUE SHIELD | Admitting: Physician Assistant

## 2015-09-12 ENCOUNTER — Encounter: Payer: Self-pay | Admitting: Physician Assistant

## 2015-09-12 VITALS — BP 102/78 | HR 66 | Temp 98.2°F | Resp 16 | Ht 69.0 in | Wt 214.1 lb

## 2015-09-12 DIAGNOSIS — N529 Male erectile dysfunction, unspecified: Secondary | ICD-10-CM | POA: Diagnosis not present

## 2015-09-12 MED ORDER — EPINEPHRINE 0.3 MG/0.3ML IJ SOAJ: 0.3000 mg | Freq: Once | INTRAMUSCULAR | Status: DC

## 2015-09-12 MED ORDER — SILDENAFIL CITRATE 20 MG PO TABS: ORAL_TABLET | ORAL | Status: DC

## 2015-09-12 MED ORDER — ALBUTEROL SULFATE HFA 108 (90 BASE) MCG/ACT IN AERS: 1.0000 | INHALATION_SPRAY | Freq: Four times a day (QID) | RESPIRATORY_TRACT | Status: DC | PRN

## 2015-09-12 NOTE — Progress Notes (Signed)
   Patient presents to clinic today for medication management of erectile dysfunction. Patient has previously taken Cialis 5 mg with good results. However insurance will no longer pay for the medication and he cannot afford. Would like to discuss other options. Denies new or worsening symptoms.  Past Medical History  Diagnosis Date  . Genital herpes   . Allergic rhinitis   . Asthma   . Blood pressure elevated without history of HTN   . Chicken pox   . Low testosterone     Current Outpatient Prescriptions on File Prior to Visit  Medication Sig Dispense Refill  . EPINEPHrine (EPIPEN) 0.3 mg/0.3 mL DEVI Inject 0.3 mLs (0.3 mg total) into the muscle once. 1 Device 11  . Multiple Vitamins-Minerals (MULTIVITAMIN PO) Take by mouth daily. [place] MENS MVI     No current facility-administered medications on file prior to visit.    Allergies  Allergen Reactions  . Shellfish Allergy Anaphylaxis    Family History  Problem Relation Age of Onset  . Coronary artery disease Neg Hx   . Stroke Neg Hx   . Diabetes Maternal Grandmother   . Hypertension Mother     Living  . Hyperlipidemia Mother   . Breast cancer Neg Hx   . Colon cancer Neg Hx   . Prostate cancer Neg Hx   . Diverticulitis Mother   . Healthy Father     Living  . Diabetes Brother   . Healthy Sister   . Allergies Daughter     Social History   Social History  . Marital Status: Married    Spouse Name: N/A  . Number of Children: N/A  . Years of Education: N/A   Social History Main Topics  . Smoking status: Never Smoker   . Smokeless tobacco: Never Used  . Alcohol Use: 3.6 oz/week    6 Cans of beer per week  . Drug Use: No  . Sexual Activity: Yes    Birth Control/ Protection: Surgical   Other Topics Concern  . None   Social History Science writer for [employer]   Married over 78 years   53 yr old daughter         Review of Systems - See HPI.  All other ROS are negative.  BP 102/78 mmHg  Pulse 66   Temp(Src) 98.2 F (36.8 C) (Oral)  Resp 16  Ht 5\' 9"  (1.753 m)  Wt 214 lb 2 oz (97.126 kg)  BMI 31.61 kg/m2  SpO2 98%  Physical Exam  Constitutional: He is oriented to person, place, and time and well-developed, well-nourished, and in no distress.  HENT:  Head: Normocephalic and atraumatic.  Cardiovascular: Normal rate, regular rhythm, normal heart sounds and intact distal pulses.   Pulmonary/Chest: Effort normal.  Neurological: He is alert and oriented to person, place, and time.  Skin: Skin is warm and dry. No rash noted.  Psychiatric: Affect normal.  Vitals reviewed.   No results found for this or any previous visit (from the past 2160 hour(s)).  Assessment/Plan: 1. Erectile dysfunction, unspecified erectile dysfunction type Will begin trial of generic Viagra 20-40 mg as directed for ED. RX sent to [facility] as they offer cheaper options for ED. Patient to schedule appt for CPE.

## 2015-09-12 NOTE — Progress Notes (Signed)
Pre visit review using our clinic review tool, if applicable. No additional management support is needed unless otherwise documented below in the visit note/SLS  

## 2015-09-12 NOTE — Patient Instructions (Signed)
Please remember to pick up your prescription for generic viagra at [facility]. Let me know how it is working for you. Schedule an appointment for a complete physical with fasting labs.  [facility] [phone] [address] [place], [place], [place] [address]

## 2015-09-22 MED FILL — EPIPEN 2-PAK 0.3 MG AUTO-IN: 0.3 | 2 days supply | Qty: 2 | Fill #0

## 2015-09-30 NOTE — Telephone Encounter (Signed)
PA denied. His plan does not cover any medications prescribed for ED. JG//CMA

## 2015-10-31 ENCOUNTER — Telehealth: Payer: Self-pay | Admitting: Medical

## 2015-10-31 ENCOUNTER — Telehealth: Payer: Self-pay | Admitting: Physician Assistant

## 2015-10-31 ENCOUNTER — Ambulatory Visit (INDEPENDENT_AMBULATORY_CARE_PROVIDER_SITE_OTHER): Payer: BLUE CROSS/BLUE SHIELD | Admitting: Medical

## 2015-10-31 VITALS — BP 123/81 | HR 65 | Temp 98.9°F | Ht 69.0 in | Wt 212.2 lb

## 2015-10-31 DIAGNOSIS — K429 Umbilical hernia without obstruction or gangrene: Secondary | ICD-10-CM

## 2015-10-31 DIAGNOSIS — Z0001 Encounter for general adult medical examination with abnormal findings: Secondary | ICD-10-CM | POA: Diagnosis not present

## 2015-10-31 DIAGNOSIS — Z0000 Encounter for general adult medical examination without abnormal findings: Secondary | ICD-10-CM | POA: Insufficient documentation

## 2015-10-31 LAB — POCT URINALYSIS DIPSTICK
Bilirubin, UA: NEGATIVE
Blood, UA: NEGATIVE
Glucose, UA: NEGATIVE
KETONES UA: NEGATIVE
LEUKOCYTES UA: NEGATIVE
NITRITE UA: NEGATIVE
PROTEIN UA: NEGATIVE
Spec Grav, UA: >=1.03
Urobilinogen, UA: 4
pH, UA: 6

## 2015-10-31 LAB — COMPREHENSIVE METABOLIC PANEL
ALBUMIN: 4.2 g/dL (ref 3.5–5.2)
ALT: 22 U/L (ref 0–53)
AST: 17 U/L (ref 0–37)
Alkaline Phosphatase: 34 U/L — ABNORMAL LOW (ref 39–117)
BUN: 12 mg/dL (ref 6–23)
CALCIUM: 9.8 mg/dL (ref 8.4–10.5)
CHLORIDE: 104 meq/L (ref 96–112)
CO2: 30 meq/L (ref 19–32)
Creatinine, Ser: 1.29 mg/dL (ref 0.40–1.50)
GFR: 78.37 mL/min (ref >60.00)
Glucose, Bld: 100 mg/dL — ABNORMAL HIGH (ref 70–99)
POTASSIUM: 4.3 meq/L (ref 3.5–5.1)
Sodium: 138 mEq/L (ref 135–145)
Total Bilirubin: 0.6 mg/dL (ref 0.2–1.2)
Total Protein: 7.2 g/dL (ref 6.0–8.3)

## 2015-10-31 LAB — LIPID PANEL
CHOLESTEROL: 187 mg/dL (ref 0–200)
HDL: 55.4 mg/dL (ref >39.00)
LDL CALC: 104 mg/dL — AB (ref 0–99)
NonHDL: 131.82
TRIGLYCERIDES: 141 mg/dL (ref 0.0–149.0)
Total CHOL/HDL Ratio: 3
VLDL: 28.2 mg/dL (ref 0.0–40.0)

## 2015-10-31 LAB — CBC WITH DIFFERENTIAL/PLATELET
BASOS ABS: 0 10*3/uL (ref 0.0–0.1)
BASOS PCT: 0.8 % (ref 0.0–3.0)
EOS ABS: 0.1 10*3/uL (ref 0.0–0.7)
Eosinophils Relative: 2.2 % (ref 0.0–5.0)
HEMATOCRIT: 42.8 % (ref 39.0–52.0)
HEMOGLOBIN: 14.7 g/dL (ref 13.0–17.0)
LYMPHS ABS: 1.3 10*3/uL (ref 0.7–4.0)
LYMPHS PCT: 36.6 % (ref 12.0–46.0)
MCHC: 34.3 g/dL (ref 30.0–36.0)
MCV: 91.9 fl (ref 78.0–100.0)
MONO ABS: 0.4 10*3/uL (ref 0.1–1.0)
Monocytes Relative: 10.5 % (ref 3.0–12.0)
NEUTROS ABS: 1.8 10*3/uL (ref 1.4–7.7)
Neutrophils Relative %: 49.9 % (ref 43.0–77.0)
PLATELETS: 278 10*3/uL (ref 150.0–400.0)
RBC: 4.66 Mil/uL (ref 4.22–5.81)
RDW: 14.2 % (ref 11.5–15.5)
WBC: 3.6 10*3/uL — ABNORMAL LOW (ref 4.0–10.5)

## 2015-10-31 LAB — TSH: TSH: 1.42 u[IU]/mL (ref 0.35–4.50)

## 2015-10-31 MED ORDER — VALACYCLOVIR HCL 1 G PO TABS: 1000.0000 mg | ORAL_TABLET | Freq: Every day | ORAL | Status: DC

## 2015-10-31 NOTE — Telephone Encounter (Signed)
Pt called in because he was just seen by provider Percell Miller) He says that he was prescribed valACYclovir. Pt says that pharmacy sent to medication is out of stock. Pt would like to have medication sent to [facility] on [facility] instead

## 2015-10-31 NOTE — Progress Notes (Signed)
Pre visit review using our clinic review tool, if applicable. No additional management support is needed unless otherwise documented below in the visit note. 

## 2015-10-31 NOTE — Progress Notes (Addendum)
Subjective:    Patient ID: Ray Lopez, male    DOB: 1975/08/09, 40 y.o.   MRN: [id number]  HPI  Pt in for physical. He is fasting.  Pt works Teacher, music, Pt works out regularly cardio and weight lifting 2-3 times a week. Has reported moderate healthy diet. Eats a lot of fruit. Minimizes fried food. 2 beers or glass of wine a day. None smoker. Married- daughter.  Pt feels good today. Not reporting. He only wants refill of valtrex for preventative treatment. Herpes since 40 yo. He was off med for 10-15 years. Recent life stress and seems getting more outbreaks.  Pt does have vacation in July.  Pt had some umbilical pain. When he squats.    Review of Systems  Constitutional: Negative for fever, chills and fatigue.  HENT: Negative for congestion, drooling, facial swelling, hearing loss, sinus pressure, sore throat and tinnitus.   Respiratory: Negative for cough, shortness of breath and wheezing.   Cardiovascular: Negative for chest pain and palpitations.  Gastrointestinal: Negative for nausea, vomiting, abdominal pain and blood in stool.  Genitourinary: Negative for dysuria and enuresis.  Musculoskeletal: Negative for back pain and neck pain.  Skin: Negative for rash.  Allergic/Immunologic: Negative for environmental allergies and immunocompromised state.  Neurological: Negative for dizziness and light-headedness.  Hematological: Negative for adenopathy. Does not bruise/bleed easily.  Psychiatric/Behavioral: Negative for hallucinations, behavioral problems, confusion and dysphoric mood.     Past Medical History  Diagnosis Date  . Genital herpes   . Allergic rhinitis   . Asthma   . Blood pressure elevated without history of HTN   . Chicken pox   . Low testosterone      Social History   Social History  . Marital Status: Married    Spouse Name: N/A  . Number of Children: N/A  . Years of Education: N/A   Occupational History  . Not on file.   Social History  Main Topics  . Smoking status: Never Smoker   . Smokeless tobacco: Never Used  . Alcohol Use: 3.6 oz/week    6 Cans of beer per week  . Drug Use: No  . Sexual Activity: Yes    Birth Control/ Protection: Surgical   Other Topics Concern  . 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 Vasectomy  10/2010  . Refractive surgery  4/13    lasix eye surgery    Family History  Problem Relation Age of Onset  . Coronary artery disease Neg Hx   . Stroke Neg Hx   . Diabetes Maternal Grandmother   . Hypertension Mother     Living  . Hyperlipidemia Mother   . Breast cancer Neg Hx   . Colon cancer Neg Hx   . Prostate cancer Neg Hx   . Diverticulitis Mother   . Healthy Father     Living  . Diabetes Brother   . Healthy Sister   . Allergies Daughter     Allergies  Allergen Reactions  . Shellfish Allergy Anaphylaxis    Current Outpatient Prescriptions on File Prior to Visit  Medication Sig Dispense Refill  . albuterol (PROAIR HFA) 108 (90 Base) MCG/ACT inhaler Inhale 1-2 puffs into the lungs every 6 (six) hours as needed for wheezing or shortness of breath. 6.7  g 5  . EPINEPHrine 0.3 mg/0.3 mL IJ SOAJ injection Inject 0.3 mLs (0.3 mg total) into the muscle once. 1 Device 3  . Multiple Vitamins-Minerals (MULTIVITAMIN PO) Take by mouth daily. [employer] MENS MVI    . sildenafil (REVATIO) 20 MG tablet Take 1-2 tablets as directed 30 min - 1 hour before intercourse. No more than 1 dose in 24 hours. 50 tablet 0   No current facility-administered medications on file prior to visit.    BP 123/81 mmHg  Pulse 65  Temp(Src) 98.9 F (37.2 C) (Oral)  Ht 5\' 9"  (1.753 m)  Wt 212 lb 3.2 oz (96.253 kg)  BMI 31.32 kg/m2  SpO2 99%       Objective:   Physical Exam  General Mental Status- Alert. General Appearance- Not in acute distress.   Skin General: Color-  Normal Color. Moisture- Normal Moisture. No worrisome lesions. Neck Carotid Arteries- Normal color. Moisture- Normal Moisture. No carotid bruits. No JVD.  Chest and Lung Exam Auscultation: Breath Sounds:-Normal.  Cardiovascular Auscultation:Rythm- Regular. Murmurs & Other Heart Sounds:Auscultation of the heart reveals- No Murmurs.  Abdomen Inspection:-Inspeection Normal. Palpation/Percussion:Note:No mass. Palpation and Percussion of the abdomen reveal- Non Tender, Non Distended + BS, no rebound or guarding. Very tiny umbilical hernia. On straight leg lifts protrudes very minimally.   Neurologic Cranial Nerve exam:- CN III-XII intact(No nystagmus), symmetric smile. Strength:- 5/5 equal and symmetric strength both upper and lower extremities.  Genital exam- no inguinal hernia. No obvious lesions(2-3 herpes break outs over last year)  Back- no cva pain.      Assessment & Plan:  Wellness examination Will get cbc, cmp, tsh, lipid panel and ua.   For your small umbilical hernia would recommend stopping squats or other exercises that increase intraabdominal pressure. If area enlarges let us know and would refer to general surgeon.  For history of herpes and increased outbreak frequency will rx refills of valtrex.  Follow up as regularly scheduled with pcp or as needed  [facility] Service, Percell Miller, PA-C

## 2015-10-31 NOTE — Addendum Note (Signed)
Addended by: Anabel Halon on: 10/31/2015 09:18 AM   Modules accepted: Orders, SmartSet

## 2015-10-31 NOTE — Patient Instructions (Addendum)
Wellness examination Will get cbc, cmp, tsh, lipid panel and ua.   For your small umbilical hernia would recommend stopping squats or other exercises that increase intraabdominal pressure. If area enlarges let us know and would refer to general surgeon.  For history of herpes and increased outbreak frequency will rx refills of valtrex.  Follow up as regularly scheduled with pcp or as need.  Preventive Care for Adults, Male A healthy lifestyle and preventive care can promote health and wellness. Preventive health guidelines for men include the following key practices:  A routine yearly physical is a good way to check with your health care provider about your health and preventative screening. It is a chance to share any concerns and updates on your health and to receive a thorough exam.  Visit your dentist for a routine exam and preventative care every 6 months. Brush your teeth twice a day and floss once a day. Good oral hygiene prevents tooth decay and gum disease.  The frequency of eye exams is based on your age, health, family medical history, use of contact lenses, and other factors. Follow your health care provider's recommendations for frequency of eye exams.  Eat a healthy diet. Foods such as vegetables, fruits, whole grains, low-fat dairy products, and lean protein foods contain the nutrients you need without too many calories. Decrease your intake of foods high in solid fats, added sugars, and salt. Eat the right amount of calories for you.Get information about a proper diet from your health care provider, if necessary.  Regular physical exercise is one of the most important things you can do for your health. Most adults should get at least 150 minutes of moderate-intensity exercise (any activity that increases your heart rate and causes you to sweat) each week. In addition, most adults need muscle-strengthening exercises on 2 or more days a week.  Maintain a healthy weight. The body  mass index (BMI) is a screening tool to identify possible weight problems. It provides an estimate of body fat based on height and weight. Your health care provider can find your BMI and can help you achieve or maintain a healthy weight.For adults 20 years and older:  A BMI below 18.5 is considered underweight.  A BMI of 18.5 to 24.9 is normal.  A BMI of 25 to 29.9 is considered overweight.  A BMI of 30 and above is considered obese.  Maintain normal blood lipids and cholesterol levels by exercising and minimizing your intake of saturated fat. Eat a balanced diet with plenty of fruit and vegetables. Blood tests for lipids and cholesterol should begin at age 64 and be repeated every 5 years. If your lipid or cholesterol levels are high, you are over 50, or you are at high risk for heart disease, you may need your cholesterol levels checked more frequently.Ongoing high lipid and cholesterol levels should be treated with medicines if diet and exercise are not working.  If you smoke, find out from your health care provider how to quit. If you do not use tobacco, do not start.  Lung cancer screening is recommended for adults aged 63-80 years who are at high risk for developing lung cancer because of a history of smoking. A yearly low-dose CT scan of the lungs is recommended for people who have at least a 30-pack-year history of smoking and are a current smoker or have quit within the past 15 years. A pack year of smoking is smoking an average of 1 pack of cigarettes a day  for 1 year (for example: 1 pack a day for 30 years or 2 packs a day for 15 years). Yearly screening should continue until the smoker has stopped smoking for at least 15 years. Yearly screening should be stopped for people who develop a health problem that would prevent them from having lung cancer treatment.  If you choose to drink alcohol, do not have more than 2 drinks per day. One drink is considered to be 12 ounces (355 mL) of  beer, 5 ounces (148 mL) of wine, or 1.5 ounces (44 mL) of liquor.  Avoid use of street drugs. Do not share needles with anyone. Ask for help if you need support or instructions about stopping the use of drugs.  High blood pressure causes heart disease and increases the risk of stroke. Your blood pressure should be checked at least every 1-2 years. Ongoing high blood pressure should be treated with medicines, if weight loss and exercise are not effective.  If you are 37-83 years old, ask your health care provider if you should take aspirin to prevent heart disease.  Diabetes screening is done by taking a blood sample to check your blood glucose level after you have not eaten for a certain period of time (fasting). If you are not overweight and you do not have risk factors for diabetes, you should be screened once every 3 years starting at age 62. If you are overweight or obese and you are 54-68 years of age, you should be screened for diabetes every year as part of your cardiovascular risk assessment.  Colorectal cancer can be detected and often prevented. Most routine colorectal cancer screening begins at the age of 15 and continues through age 42. However, your health care provider may recommend screening at an earlier age if you have risk factors for colon cancer. On a yearly basis, your health care provider may provide home test kits to check for hidden blood in the stool. Use of a small camera at the end of a tube to directly examine the colon (sigmoidoscopy or colonoscopy) can detect the earliest forms of colorectal cancer. Talk to your health care provider about this at age 69, when routine screening begins. Direct exam of the colon should be repeated every 5-10 years through age 44, unless early forms of precancerous polyps or small growths are found.  People who are at an increased risk for hepatitis B should be screened for this virus. You are considered at high risk for hepatitis B if:  You  were born in a country where hepatitis B occurs often. Talk with your health care provider about which countries are considered high risk.  Your parents were born in a high-risk country and you have not received a shot to protect against hepatitis B (hepatitis B vaccine).  You have HIV or AIDS.  You use needles to inject street drugs.  You live with, or have sex with, someone who has hepatitis B.  You are a man who has sex with other men (MSM).  You get hemodialysis treatment.  You take certain medicines for conditions such as cancer, organ transplantation, and autoimmune conditions.  Hepatitis C blood testing is recommended for all people born from [birth year] through 1965 and any individual with known risks for hepatitis C.  Practice safe sex. Use condoms and avoid high-risk sexual practices to reduce the spread of sexually transmitted infections (STIs). STIs include gonorrhea, chlamydia, syphilis, trichomonas, herpes, HPV, and human immunodeficiency virus (HIV). Herpes, HIV, and HPV are  viral illnesses that have no cure. They can result in disability, cancer, and death.  If you are a man who has sex with other men, you should be screened at least once per year for:  HIV.  Urethral, rectal, and pharyngeal infection of gonorrhea, chlamydia, or both.  If you are at risk of being infected with HIV, it is recommended that you take a prescription medicine daily to prevent HIV infection. This is called preexposure prophylaxis (PrEP). You are considered at risk if:  You are a man who has sex with other men (MSM) and have other risk factors.  You are a heterosexual man, are sexually active, and are at increased risk for HIV infection.  You take drugs by injection.  You are sexually active with a partner who has HIV.  Talk with your health care provider about whether you are at high risk of being infected with HIV. If you choose to begin PrEP, you should first be tested for HIV. You should then  be tested every 3 months for as long as you are taking PrEP.  A one-time screening for abdominal aortic aneurysm (AAA) and surgical repair of large AAAs by ultrasound are recommended for men ages 21 to 33 years who are current or former smokers.  Healthy men should no longer receive prostate-specific antigen (PSA) blood tests as part of routine cancer screening. Talk with your health care provider about prostate cancer screening.  Testicular cancer screening is not recommended for adult males who have no symptoms. Screening includes self-exam, a health care provider exam, and other screening tests. Consult with your health care provider about any symptoms you have or any concerns you have about testicular cancer.  Use sunscreen. Apply sunscreen liberally and repeatedly throughout the day. You should seek shade when your shadow is shorter than you. Protect yourself by wearing long sleeves, pants, a wide-brimmed hat, and sunglasses year round, whenever you are outdoors.  Once a month, do a whole-body skin exam, using a mirror to look at the skin on your back. Tell your health care provider about new moles, moles that have irregular borders, moles that are larger than a pencil eraser, or moles that have changed in shape or color.  Stay current with required vaccines (immunizations).  Influenza vaccine. All adults should be immunized every year.  Tetanus, diphtheria, and acellular pertussis (Td, Tdap) vaccine. An adult who has not previously received Tdap or who does not know his vaccine status should receive 1 dose of Tdap. This initial dose should be followed by tetanus and diphtheria toxoids (Td) booster doses every 10 years. Adults with an unknown or incomplete history of completing a 3-dose immunization series with Td-containing vaccines should begin or complete a primary immunization series including a Tdap dose. Adults should receive a Td booster every 10 years.  Varicella vaccine. An adult  without evidence of immunity to varicella should receive 2 doses or a second dose if he has previously received 1 dose.  Human papillomavirus (HPV) vaccine. Males aged 11-21 years who have not received the vaccine previously should receive the 3-dose series. Males aged 22-26 years may be immunized. Immunization is recommended through the age of 41 years for any male who has sex with males and did not get any or all doses earlier. Immunization is recommended for any person with an immunocompromised condition through the age of 72 years if he did not get any or all doses earlier. During the 3-dose series, the second dose should be obtained  4-8 weeks after the first dose. The third dose should be obtained 24 weeks after the first dose and 16 weeks after the second dose.  Zoster vaccine. One dose is recommended for adults aged 36 years or older unless certain conditions are present.  Measles, mumps, and rubella (MMR) vaccine. Adults born before [birth year] generally are considered immune to measles and mumps. Adults born in [birth year] or later should have 1 or more doses of MMR vaccine unless there is a contraindication to the vaccine or there is laboratory evidence of immunity to each of the three diseases. A routine second dose of MMR vaccine should be obtained at least 28 days after the first dose for students attending postsecondary schools, health care workers, or international travelers. People who received inactivated measles vaccine or an unknown type of measles vaccine during 1963-1967 should receive 2 doses of MMR vaccine. People who received inactivated mumps vaccine or an unknown type of mumps vaccine before 1979 and are at high risk for mumps infection should consider immunization with 2 doses of MMR vaccine. Unvaccinated health care workers born before [birth year] who lack laboratory evidence of measles, mumps, or rubella immunity or laboratory confirmation of disease should consider measles and mumps immunization with  2 doses of MMR vaccine or rubella immunization with 1 dose of MMR vaccine.  Pneumococcal 13-valent conjugate (PCV13) vaccine. When indicated, a person who is uncertain of his immunization history and has no record of immunization should receive the PCV13 vaccine. All adults 106 years of age and older should receive this vaccine. An adult aged 31 years or older who has certain medical conditions and has not been previously immunized should receive 1 dose of PCV13 vaccine. This PCV13 should be followed with a dose of pneumococcal polysaccharide (PPSV23) vaccine. Adults who are at high risk for pneumococcal disease should obtain the PPSV23 vaccine at least 8 weeks after the dose of PCV13 vaccine. Adults older than 42 years of age who have normal immune system function should obtain the PPSV23 vaccine dose at least 1 year after the dose of PCV13 vaccine.  Pneumococcal polysaccharide (PPSV23) vaccine. When PCV13 is also indicated, PCV13 should be obtained first. All adults aged 36 years and older should be immunized. An adult younger than age 42 years who has certain medical conditions should be immunized. Any person who resides in a nursing home or long-term care facility should be immunized. An adult smoker should be immunized. People with an immunocompromised condition and certain other conditions should receive both PCV13 and PPSV23 vaccines. People with human immunodeficiency virus (HIV) infection should be immunized as soon as possible after diagnosis. Immunization during chemotherapy or radiation therapy should be avoided. Routine use of PPSV23 vaccine is not recommended for American Indians, [place] Natives, or people younger than 65 years unless there are medical conditions that require PPSV23 vaccine. When indicated, people who have unknown immunization and have no record of immunization should receive PPSV23 vaccine. One-time revaccination 5 years after the first dose of PPSV23 is recommended for people aged  19-64 years who have chronic kidney failure, nephrotic syndrome, asplenia, or immunocompromised conditions. People who received 1-2 doses of PPSV23 before age 71 years should receive another dose of PPSV23 vaccine at age 42 years or later if at least 5 years have passed since the previous dose. Doses of PPSV23 are not needed for people immunized with PPSV23 at or after age 41 years.  Meningococcal vaccine. Adults with asplenia or persistent complement component deficiencies should receive 2 doses  of quadrivalent meningococcal conjugate (MenACWY-D) vaccine. The doses should be obtained at least 2 months apart. Microbiologists working with certain meningococcal bacteria, [employer] recruits, people at risk during an outbreak, and people who travel to or live in countries with a high rate of meningitis should be immunized. A first-year college student up through age 28 years who is living in a residence hall should receive a dose if he did not receive a dose on or after his 16th birthday. Adults who have certain high-risk conditions should receive one or more doses of vaccine.  Hepatitis A vaccine. Adults who wish to be protected from this disease, have chronic liver disease, work with hepatitis A-infected animals, work in hepatitis A research labs, or travel to or work in countries with a high rate of hepatitis A should be immunized. Adults who were previously unvaccinated and who anticipate close contact with an international adoptee during the first 60 days after arrival in the [place] from a country with a high rate of hepatitis A should be immunized.  Hepatitis B vaccine. Adults should be immunized if they wish to be protected from this disease, are under age 3 years and have diabetes, have chronic liver disease, have had more than one sex partner in the past 6 months, may be exposed to blood or other infectious body fluids, are household contacts or sex partners of hepatitis B positive people, are  clients or workers in certain care facilities, or travel to or work in countries with a high rate of hepatitis B.  Haemophilus influenzae type b (Hib) vaccine. A previously unvaccinated person with asplenia or sickle cell disease or having a scheduled splenectomy should receive 1 dose of Hib vaccine. Regardless of previous immunization, a recipient of a hematopoietic stem cell transplant should receive a 3-dose series 6-12 months after his successful transplant. Hib vaccine is not recommended for adults with HIV infection. Preventive Service / Frequency Ages 74 to 6  Blood pressure check.** / Every 3-5 years.  Lipid and cholesterol check.** / Every 5 years beginning at age 20.  Hepatitis C blood test.** / For any individual with known risks for hepatitis C.  Skin self-exam. / Monthly.  Influenza vaccine. / Every year.  Tetanus, diphtheria, and acellular pertussis (Tdap, Td) vaccine.** / Consult your health care provider. 1 dose of Td every 10 years.  Varicella vaccine.** / Consult your health care provider.  HPV vaccine. / 3 doses over 6 months, if 75 or younger.  Measles, mumps, rubella (MMR) vaccine.** / You need at least 1 dose of MMR if you were born in 1957 or later. You may also need a second dose.  Pneumococcal 13-valent conjugate (PCV13) vaccine.** / Consult your health care provider.  Pneumococcal polysaccharide (PPSV23) vaccine.** / 1 to 2 doses if you smoke cigarettes or if you have certain conditions.  Meningococcal vaccine.** / 1 dose if you are age 40 to 24 years and a Market researcher living in a residence hall, or have one of several medical conditions. You may also need additional booster doses.  Hepatitis A vaccine.** / Consult your health care provider.  Hepatitis B vaccine.** / Consult your health care provider.  Haemophilus influenzae type b (Hib) vaccine.** / Consult your health care provider. Ages 14 to 40  Blood pressure check.** / Every  year.  Lipid and cholesterol check.** / Every 5 years beginning at age 22.  Lung cancer screening. / Every year if you are aged 30-80 years and have a 30-pack-year history  of smoking and currently smoke or have quit within the past 15 years. Yearly screening is stopped once you have quit smoking for at least 15 years or develop a health problem that would prevent you from having lung cancer treatment.  Fecal occult blood test (FOBT) of stool. / Every year beginning at age 63 and continuing until age 48. You may not have to do this test if you get a colonoscopy every 10 years.  Flexible sigmoidoscopy** or colonoscopy.** / Every 5 years for a flexible sigmoidoscopy or every 10 years for a colonoscopy beginning at age 20 and continuing until age 73.  Hepatitis C blood test.** / For all people born from [birth year] through 1965 and any individual with known risks for hepatitis C.  Skin self-exam. / Monthly.  Influenza vaccine. / Every year.  Tetanus, diphtheria, and acellular pertussis (Tdap/Td) vaccine.** / Consult your health care provider. 1 dose of Td every 10 years.  Varicella vaccine.** / Consult your health care provider.  Zoster vaccine.** / 1 dose for adults aged 22 years or older.  Measles, mumps, rubella (MMR) vaccine.** / You need at least 1 dose of MMR if you were born in 1957 or later. You may also need a second dose.  Pneumococcal 13-valent conjugate (PCV13) vaccine.** / Consult your health care provider.  Pneumococcal polysaccharide (PPSV23) vaccine.** / 1 to 2 doses if you smoke cigarettes or if you have certain conditions.  Meningococcal vaccine.** / Consult your health care provider.  Hepatitis A vaccine.** / Consult your health care provider.  Hepatitis B vaccine.** / Consult your health care provider.  Haemophilus influenzae type b (Hib) vaccine.** / Consult your health care provider. Ages 62 and over  Blood pressure check.** / Every year.  Lipid and cholesterol  check.**/ Every 5 years beginning at age 56.  Lung cancer screening. / Every year if you are aged 53-80 years and have a 30-pack-year history of smoking and currently smoke or have quit within the past 15 years. Yearly screening is stopped once you have quit smoking for at least 15 years or develop a health problem that would prevent you from having lung cancer treatment.  Fecal occult blood test (FOBT) of stool. / Every year beginning at age 26 and continuing until age 74. You may not have to do this test if you get a colonoscopy every 10 years.  Flexible sigmoidoscopy** or colonoscopy.** / Every 5 years for a flexible sigmoidoscopy or every 10 years for a colonoscopy beginning at age 65 and continuing until age 30.  Hepatitis C blood test.** / For all people born from [birth year] through 1967 and any individual with known risks for hepatitis C.  Abdominal aortic aneurysm (AAA) screening.** / A one-time screening for ages 59 to 71 years who are current or former smokers.  Skin self-exam. / Monthly.  Influenza vaccine. / Every year.  Tetanus, diphtheria, and acellular pertussis (Tdap/Td) vaccine.** / 1 dose of Td every 10 years.  Varicella vaccine.** / Consult your health care provider.  Zoster vaccine.** / 1 dose for adults aged 31 years or older.  Pneumococcal 13-valent conjugate (PCV13) vaccine.** / 1 dose for all adults aged 27 years and older.  Pneumococcal polysaccharide (PPSV23) vaccine.** / 1 dose for all adults aged 17 years and older.  Meningococcal vaccine.** / Consult your health care provider.  Hepatitis A vaccine.** / Consult your health care provider.  Hepatitis B vaccine.** / Consult your health care provider.  Haemophilus influenzae type b (Hib) vaccine.** / Consult  your health care provider. **Family history and personal history of risk and conditions may change your health care provider's recommendations.   This information is not intended to replace advice given to you  by your health care provider. Make sure you discuss any questions you have with your health care provider.   Document Released: 06/12/2001 Document Revised: 05/07/2014 Document Reviewed: 09/11/2010 Elsevier Interactive Patient Education [employer].

## 2015-10-31 NOTE — Telephone Encounter (Signed)
RX for valACYclovir sent to [facility]. Tried to contact pt to inform the rx has been sent, no answer and no vm. Did not send any refills since his regular pharmacy will get more in stock.

## 2015-10-31 NOTE — Assessment & Plan Note (Signed)
Will get cbc, cmp, tsh, lipid panel and ua.

## 2015-11-02 NOTE — Telephone Encounter (Signed)
Opened to review 

## 2015-11-02 NOTE — Addendum Note (Signed)
Addended by: Tasia Catchings on: 11/02/2015 06:41 PM   Modules accepted: Miquel Dunn

## 2015-11-03 ENCOUNTER — Telehealth: Payer: Self-pay | Admitting: Medical

## 2015-11-03 NOTE — Addendum Note (Signed)
Addended by: Anabel Halon on: 11/03/2015 08:17 AM   Modules accepted: Miquel Dunn

## 2015-11-03 NOTE — Telephone Encounter (Signed)
Opened to review 

## 2015-11-03 NOTE — Addendum Note (Signed)
Addended by: Anabel Halon on: 11/03/2015 08:15 AM   Modules accepted: Miquel Dunn

## 2015-11-05 ENCOUNTER — Other Ambulatory Visit: Payer: Self-pay | Admitting: Physician Assistant

## 2015-12-08 ENCOUNTER — Telehealth: Payer: Self-pay | Admitting: Physician Assistant

## 2015-12-08 MED ORDER — VALACYCLOVIR HCL 1 G PO TABS: 1000.0000 mg | ORAL_TABLET | Freq: Every day | ORAL | 3 refills | Status: DC

## 2015-12-08 NOTE — Telephone Encounter (Signed)
I have sent in refill 90-day supply with 3 refills to last until next physical.

## 2015-12-08 NOTE — Telephone Encounter (Signed)
Patient notified

## 2015-12-08 NOTE — Telephone Encounter (Signed)
°  Relation to PO:[id number] Call back number:[phone] Pharmacy:[email or web address]  Reason for call: pt states he saw Percell Miller in July his last visit and he had written him a rx for valtrex, but only gave him a 30 day supply, pt is wanted to know if he can get the rx for 1 yr supply

## 2015-12-15 ENCOUNTER — Other Ambulatory Visit: Payer: Self-pay | Admitting: Medical

## 2016-07-04 IMAGING — DX DG KNEE COMPLETE 4+V*L*
4 series · 4 of 4 positions shown · non-contrast
Comparison: None.

CLINICAL DATA: Knee pain for 2 years. No known injury. Initial
evaluation.

EXAM:
LEFT KNEE - COMPLETE 4+ VIEW

[knee ap]
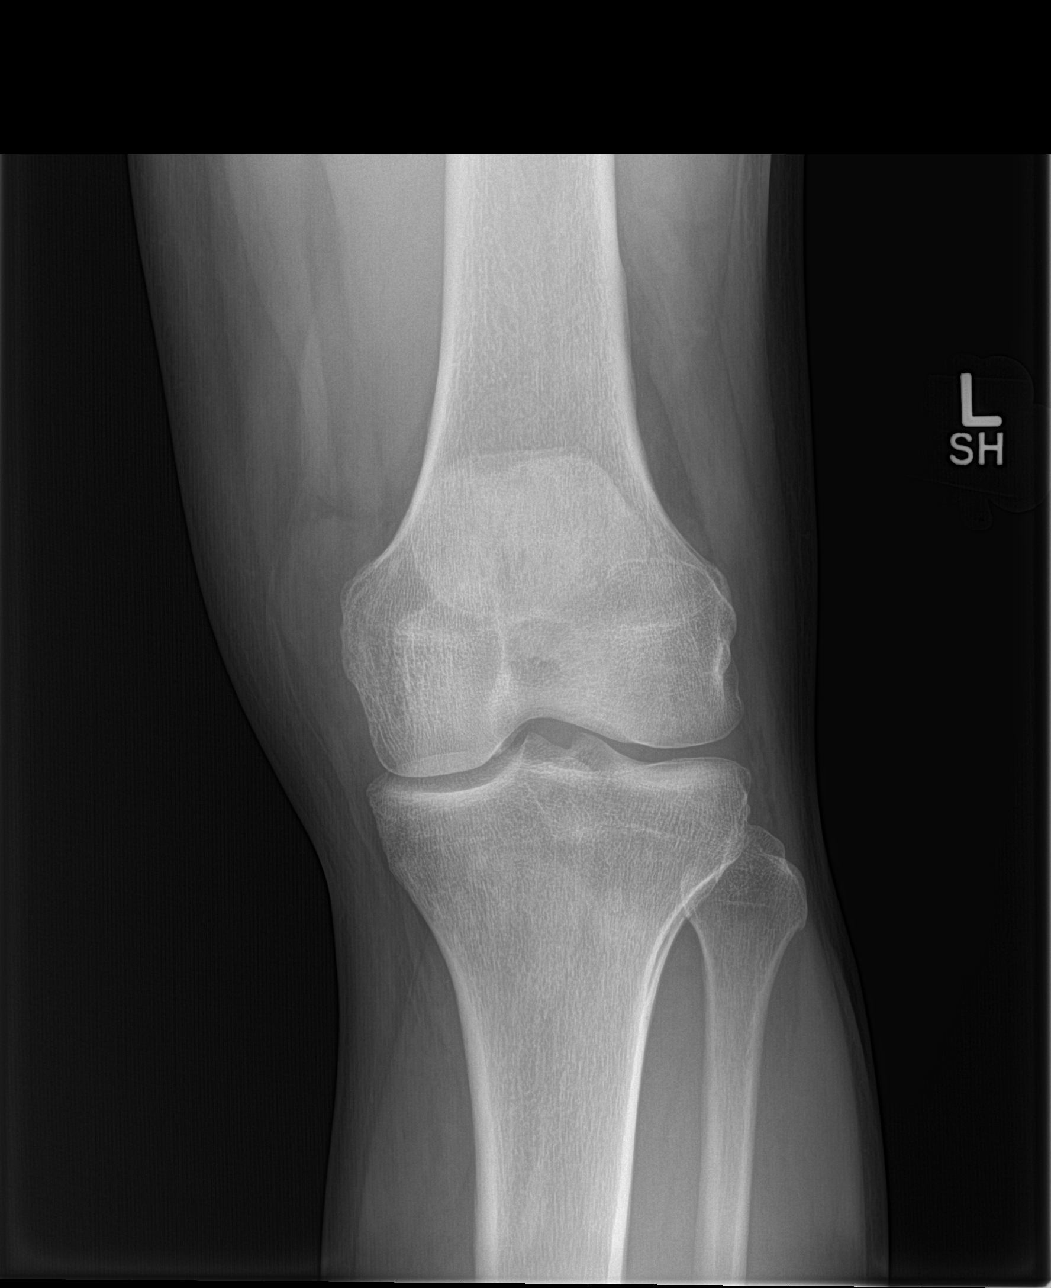

[tunnel]
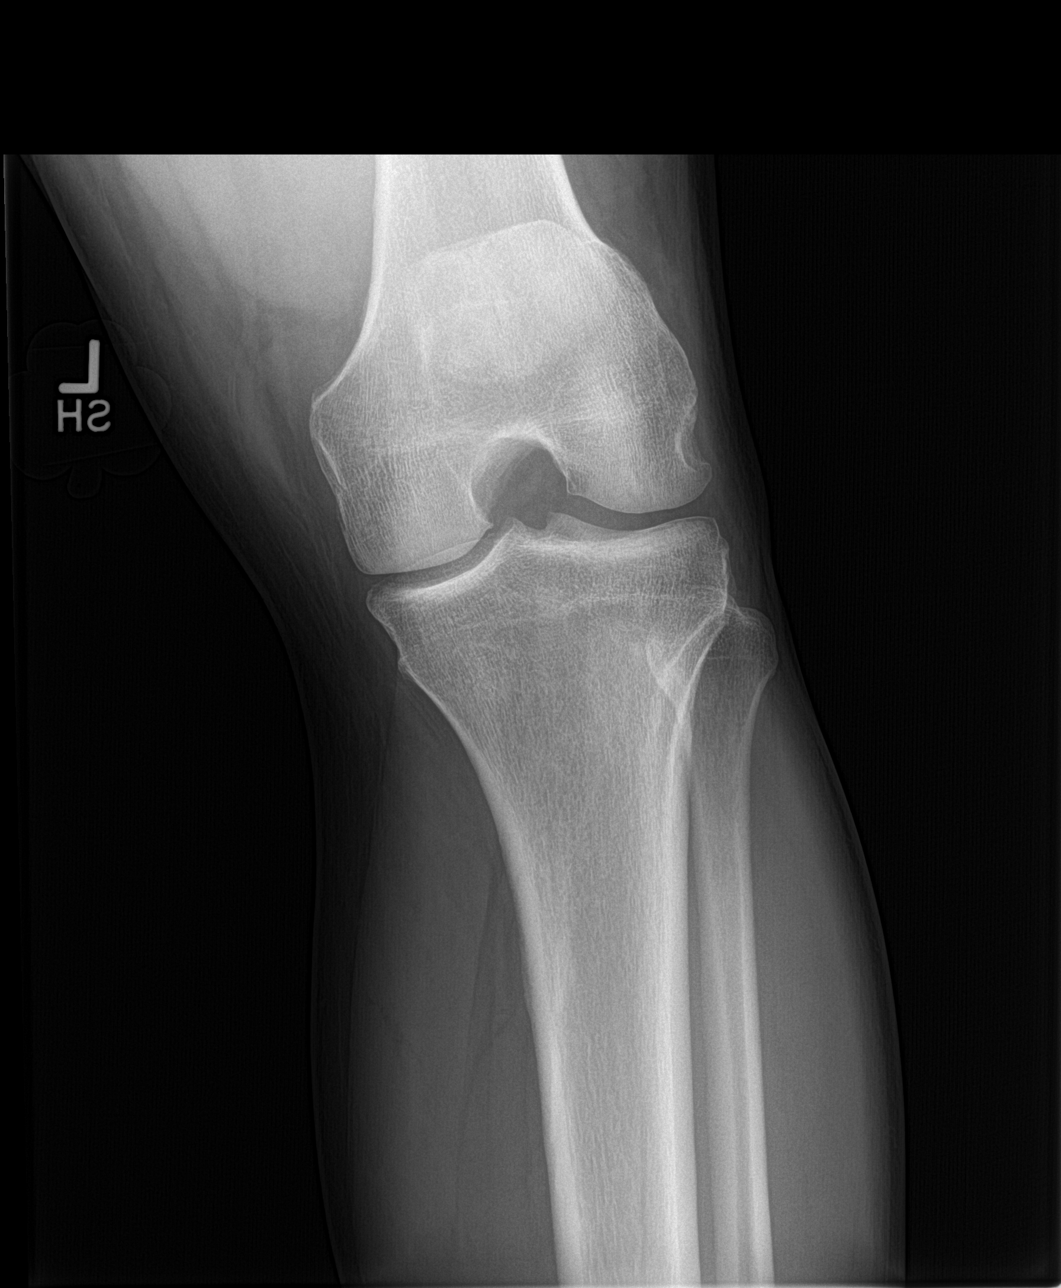

[knee lat]
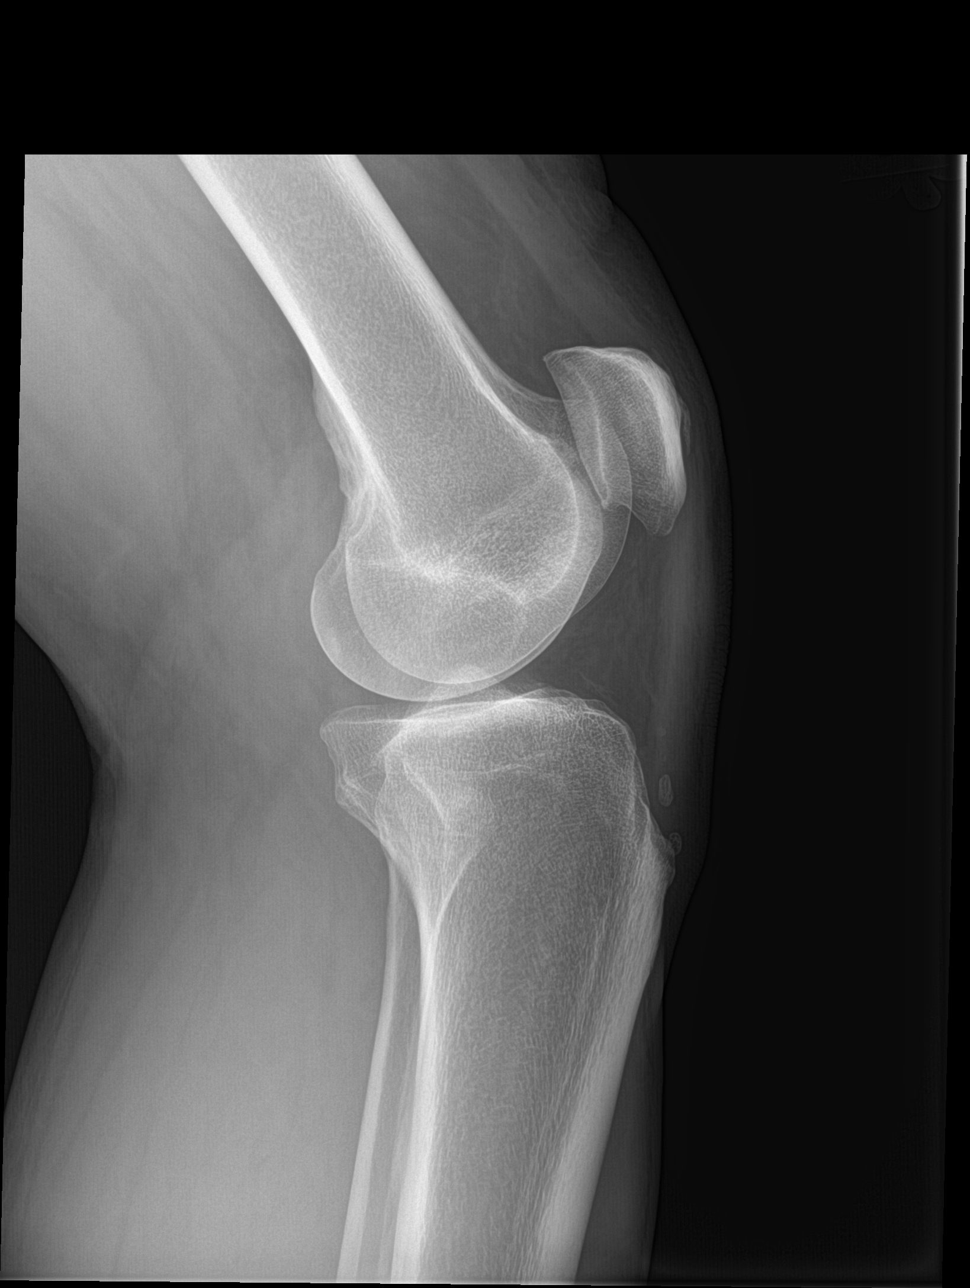

[knee sunrise]
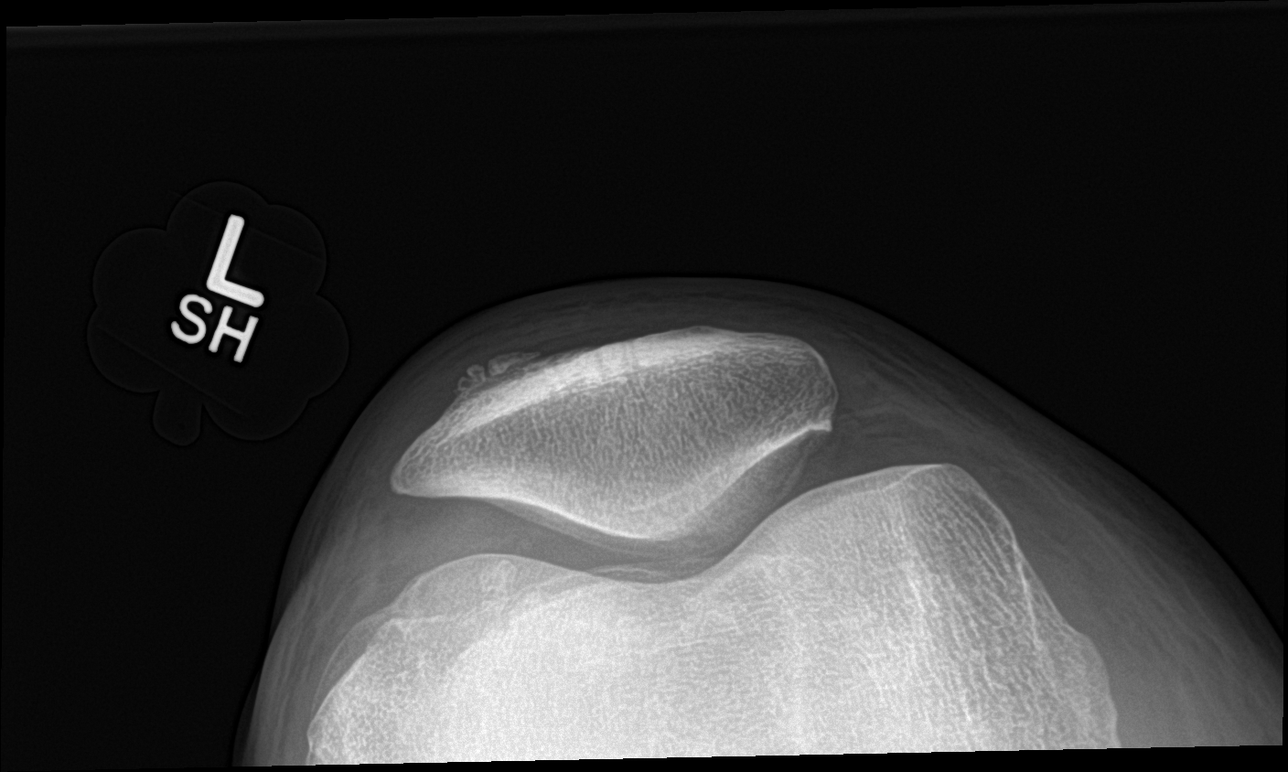

[4 of 4 positions shown; findings below may reference images not displayed]

FINDINGS: No acute bony or joint abnormality identified. No evidence of
fracture or dislocation. Very mild medial and patellofemoral
compartment degenerative change .
IMPRESSION: No acute abnormality. Very mild medial and patellofemoral
compartment degenerative change .

## 2016-07-13 IMAGING — DX DG FEMUR 2+V*L*
4 series · 4 of 4 positions shown · non-contrast
Comparison: None.

CLINICAL DATA: Left femur pain, no known injury, initial encounter

EXAM:
LEFT FEMUR 2 VIEWS

[femur ap (1 of 2)]
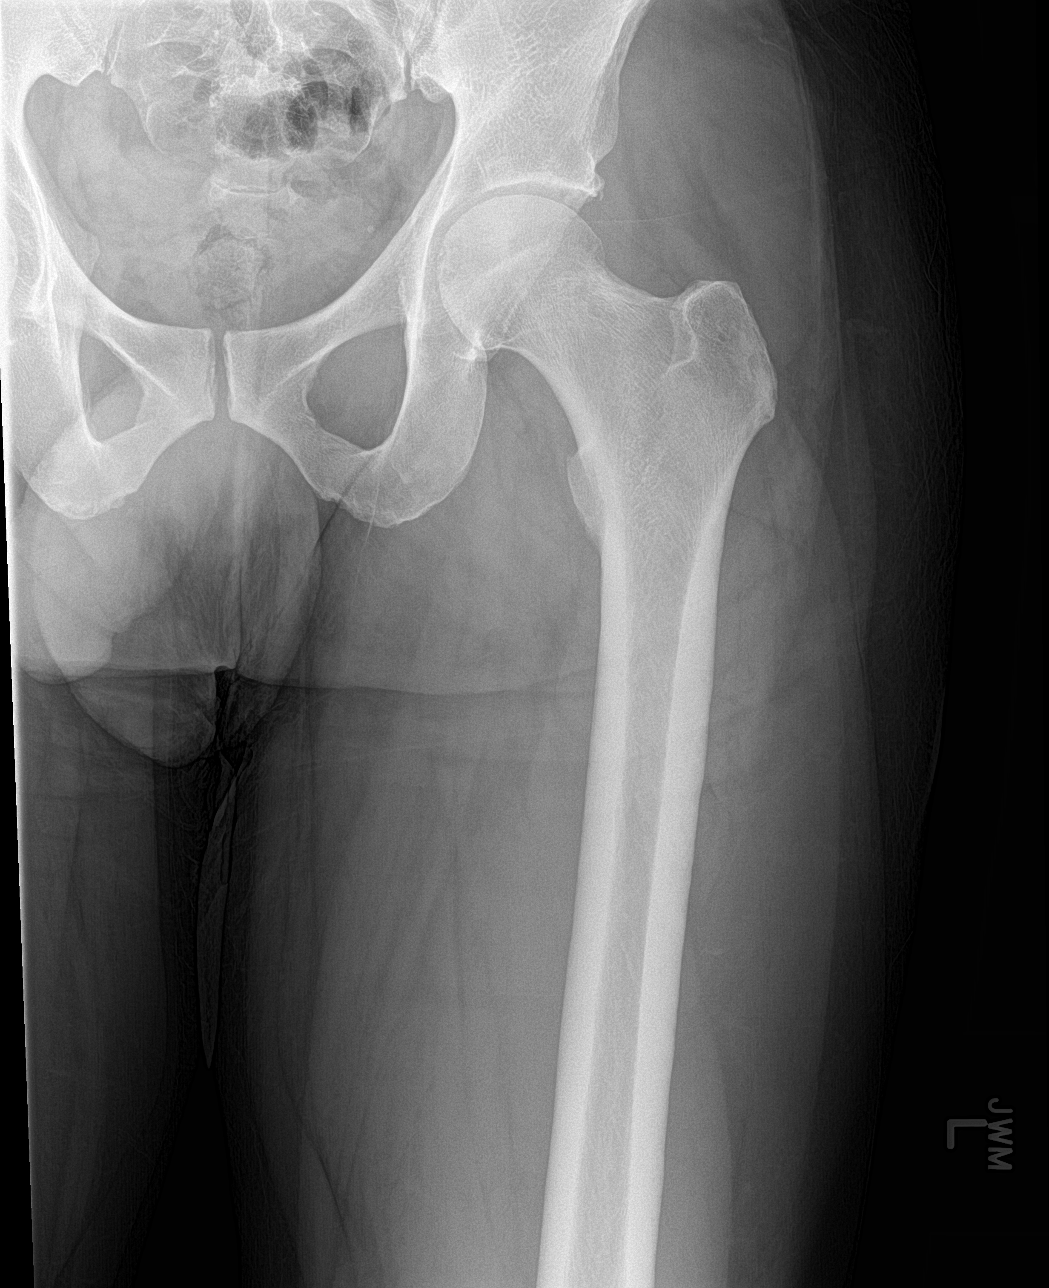

[femur ap (2 of 2)]
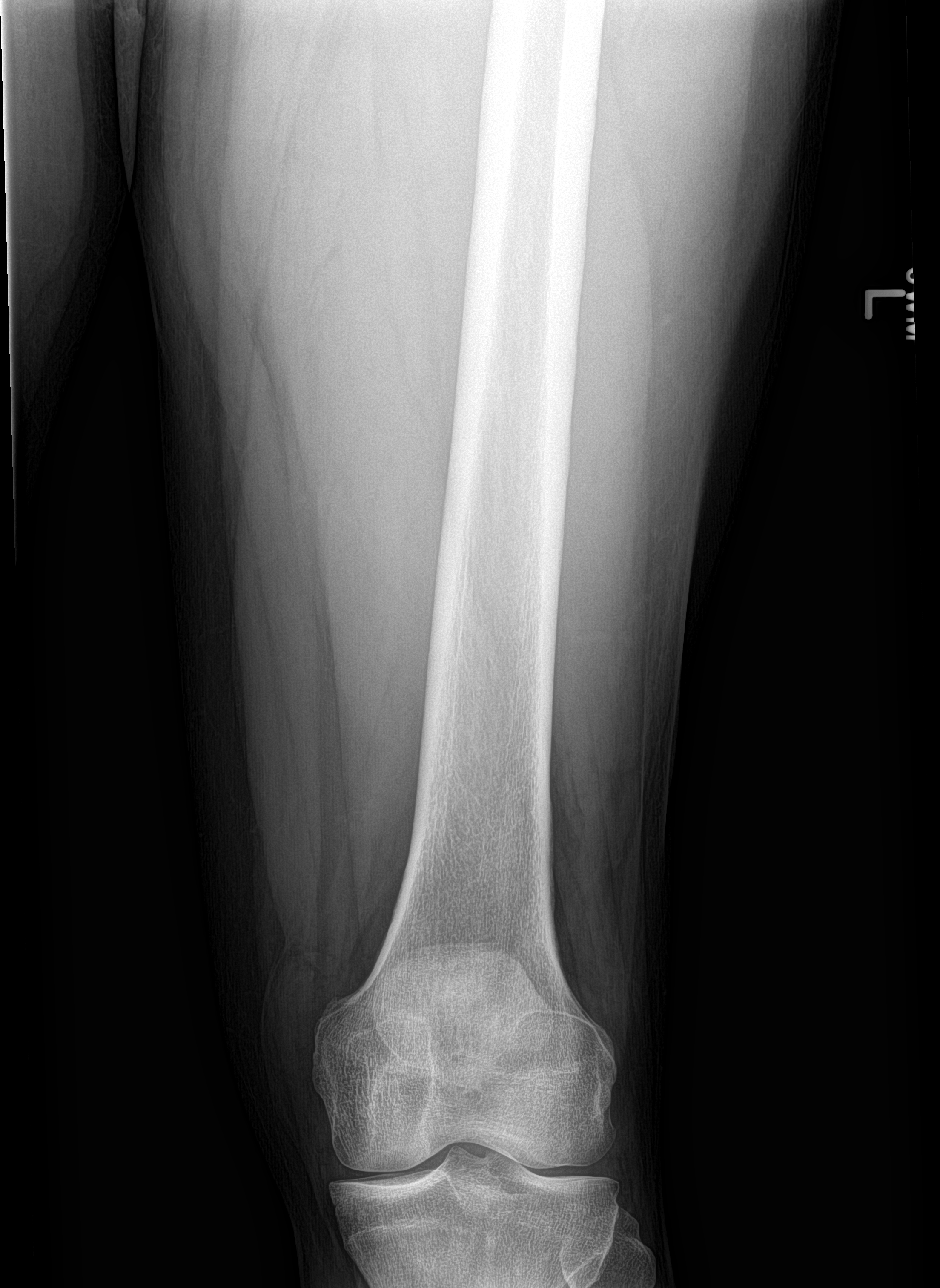

[femur lat (1 of 2)]
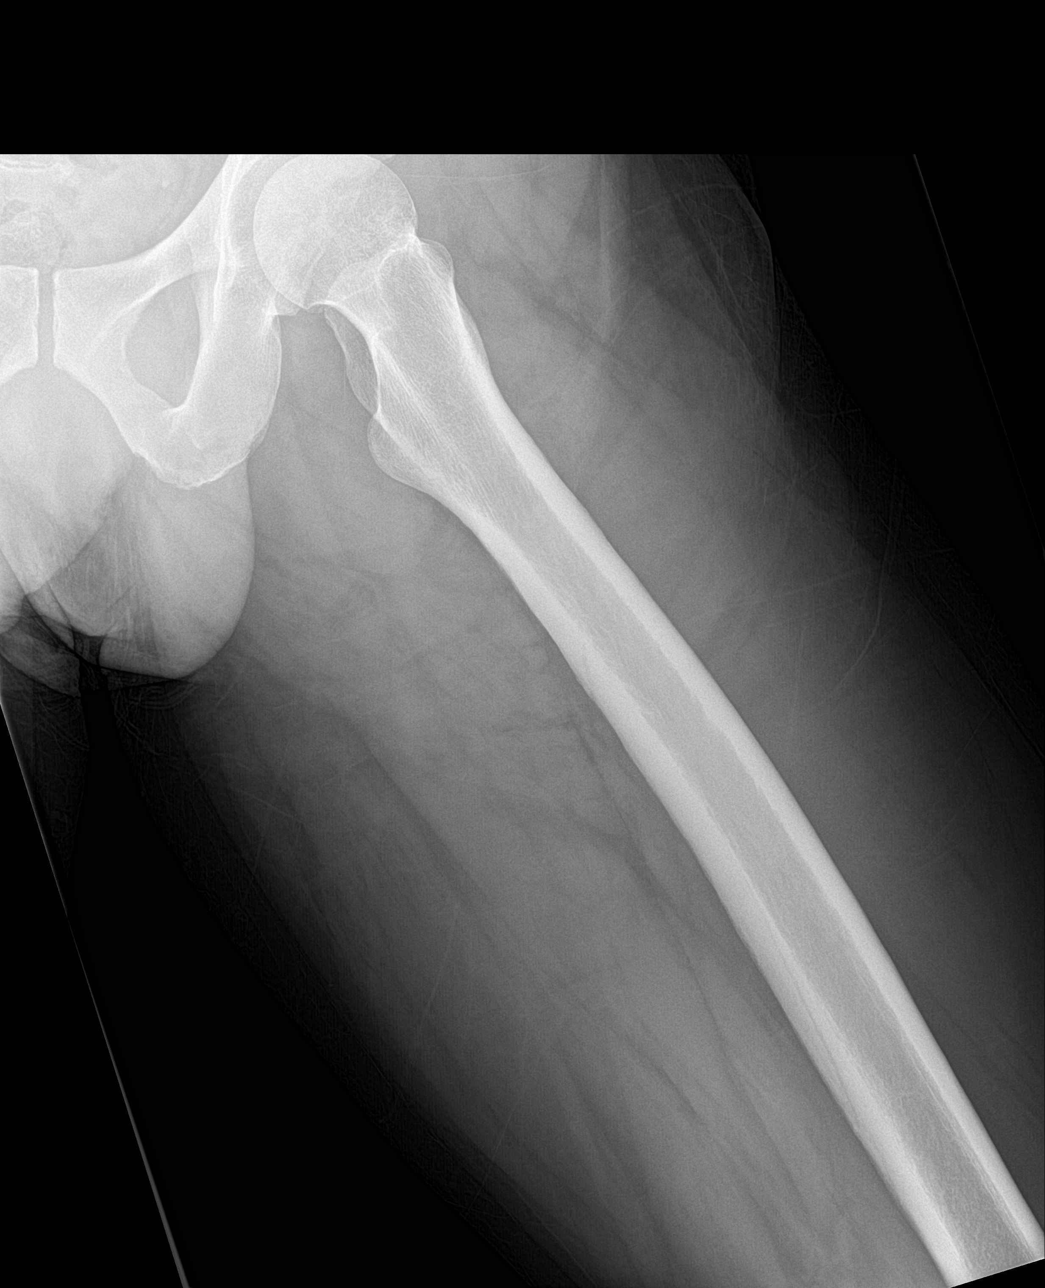

[femur lat (2 of 2)]
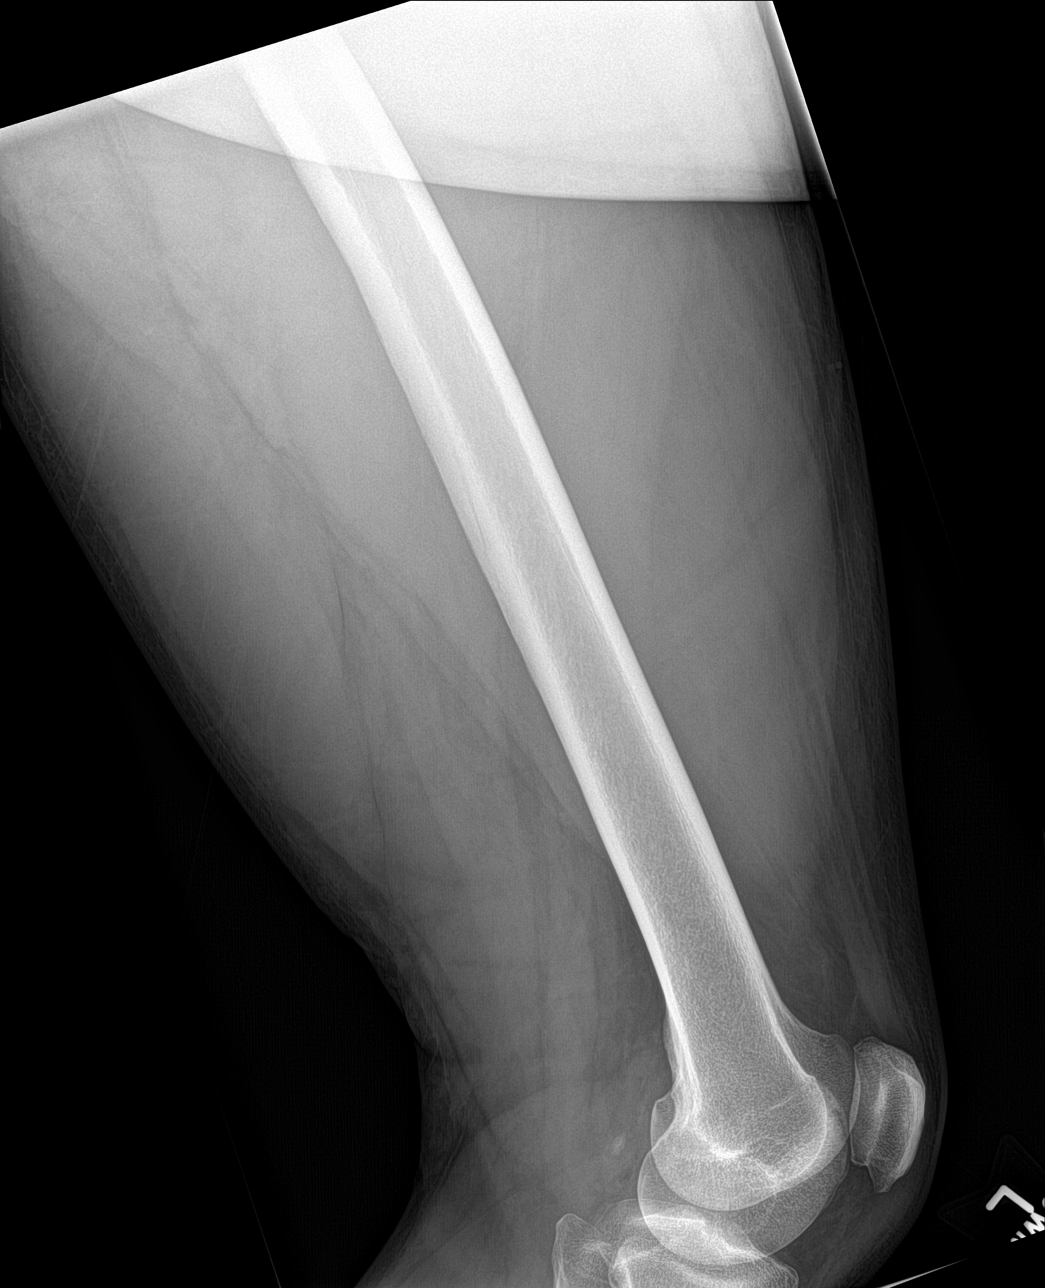

[4 of 4 positions shown; findings below may reference images not displayed]

FINDINGS: There is no evidence of fracture or other focal bone lesions. Soft
tissues are unremarkable.
IMPRESSION: No acute abnormality noted.

## 2016-11-08 ENCOUNTER — Encounter: Payer: Self-pay | Admitting: Medical

## 2016-11-08 ENCOUNTER — Ambulatory Visit (INDEPENDENT_AMBULATORY_CARE_PROVIDER_SITE_OTHER): Payer: Managed Care, Other (non HMO) | Admitting: Medical

## 2016-11-08 VITALS — BP 134/84 | HR 73 | Temp 98.3°F | Resp 19 | Ht 69.0 in | Wt 218.0 lb

## 2016-11-08 DIAGNOSIS — Z113 Encounter for screening for infections with a predominantly sexual mode of transmission: Secondary | ICD-10-CM

## 2016-11-08 DIAGNOSIS — Z0000 Encounter for general adult medical examination without abnormal findings: Secondary | ICD-10-CM | POA: Diagnosis not present

## 2016-11-08 LAB — POC URINALSYSI DIPSTICK (AUTOMATED)
Bilirubin, UA: NEGATIVE
GLUCOSE UA: NEGATIVE
Ketones, UA: NEGATIVE
LEUKOCYTES UA: NEGATIVE
NITRITE UA: NEGATIVE
Protein, UA: NEGATIVE
RBC UA: NEGATIVE
Spec Grav, UA: 1.025 (ref 1.010–1.025)
UROBILINOGEN UA: 0.2 U/dL
pH, UA: 6 (ref 5.0–8.0)

## 2016-11-08 LAB — CBC WITH DIFFERENTIAL/PLATELET
BASOS PCT: 1.3 % (ref 0.0–3.0)
Basophils Absolute: 0 10*3/uL (ref 0.0–0.1)
EOS PCT: 2.7 % (ref 0.0–5.0)
Eosinophils Absolute: 0.1 10*3/uL (ref 0.0–0.7)
HCT: 47.1 % (ref 39.0–52.0)
Hemoglobin: 15.9 g/dL (ref 13.0–17.0)
LYMPHS ABS: 1.4 10*3/uL (ref 0.7–4.0)
Lymphocytes Relative: 40.7 % (ref 12.0–46.0)
MCHC: 33.7 g/dL (ref 30.0–36.0)
MCV: 94.2 fl (ref 78.0–100.0)
MONOS PCT: 8.8 % (ref 3.0–12.0)
Monocytes Absolute: 0.3 10*3/uL (ref 0.1–1.0)
NEUTROS PCT: 46.5 % (ref 43.0–77.0)
Neutro Abs: 1.7 10*3/uL (ref 1.4–7.7)
PLATELETS: 260 10*3/uL (ref 150.0–400.0)
RBC: 5 Mil/uL (ref 4.22–5.81)
RDW: 14.2 % (ref 11.5–15.5)
WBC: 3.6 10*3/uL — ABNORMAL LOW (ref 4.0–10.5)

## 2016-11-08 LAB — LIPID PANEL
Cholesterol: 203 mg/dL — ABNORMAL HIGH (ref 0–200)
HDL: 53.7 mg/dL (ref >39.00)
LDL Cholesterol: 115 mg/dL — ABNORMAL HIGH (ref 0–99)
NONHDL: 149.69
Total CHOL/HDL Ratio: 4
Triglycerides: 171 mg/dL — ABNORMAL HIGH (ref 0.0–149.0)
VLDL: 34.2 mg/dL (ref 0.0–40.0)

## 2016-11-08 MED ORDER — VALACYCLOVIR HCL 1 G PO TABS: 1000.0000 mg | ORAL_TABLET | Freq: Every day | ORAL | 3 refills | Status: DC

## 2016-11-08 NOTE — Progress Notes (Signed)
Subjective:    Patient ID: Ray Lopez, male    DOB: 03/23/1976, 41 y.o.   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 He states job is stressful job. Travels a lot. Weekends are at home. Pt still working out 2-3 times a week. Hard to eat healthy on the road. He thinks less that 2 beers a day now. Decrease from last visit.   Married- one daughter.(vasectomy in 2013)  Pt ok with getting tdap but wants to get some time next week. He is about to travel and wants to delay vaccine.  No family history of prostate cancer    Review of Systems  Constitutional: Negative for chills, fatigue and fever.  Respiratory: Negative for cough, chest tightness and shortness of breath.   Cardiovascular: Negative for chest pain and palpitations.  Gastrointestinal: Negative for abdominal pain, blood in stool, constipation, nausea and vomiting.  Genitourinary: Negative for difficulty urinating, enuresis, frequency, genital sores, penile pain, scrotal swelling and urgency.       Pt states hx of herpes. With stressful job get outbreaks about one time a month.  Formerly on valtrex 1000 mg a day for prevention.  Musculoskeletal: Negative for back pain, myalgias, neck pain and neck stiffness.  Skin: Negative for rash.  Neurological: Negative for dizziness, seizures, syncope, weakness and headaches.  Hematological: Negative for adenopathy. Does not bruise/bleed easily.  Psychiatric/Behavioral: Negative for behavioral problems and decreased concentration.    Past Medical History:  Diagnosis Date  . Allergic rhinitis   . Asthma   . Blood pressure elevated without history of HTN   . Chicken pox   . Genital herpes   . Low testosterone      Social History   Social History  . Marital status: Married    Spouse name: N/A  . Number of children: N/A  . Years of education: N/A   Occupational History  . Not on file.   Social History Main Topics  . Smoking status: Never Smoker    . Smokeless tobacco: Never Used  . Alcohol use 3.6 oz/week    6 Cans of beer per week  . Drug use: No  . Sexual activity: Yes    Birth control/ protection: Surgical   Other Topics Concern  . Not on file   Social History Narrative   Freight forwarder for [employer]   Married over 5 years   37 yr old daughter          Past Surgical History:  Procedure Laterality Date  . REFRACTIVE SURGERY  4/13   lasix eye surgery  . VASECTOMY  10/2010    Family History  Problem Relation Age of Onset  . Diabetes Maternal Grandmother   . Hypertension Mother        Living  . Hyperlipidemia Mother   . Diverticulitis Mother   . Healthy Father        Living  . Diabetes Brother   . Healthy Sister   . Allergies Daughter   . Coronary artery disease Neg Hx   . Stroke Neg Hx   . Breast cancer Neg Hx   . Colon cancer Neg Hx   . Prostate cancer Neg Hx     Allergies  Allergen Reactions  . Shellfish Allergy Anaphylaxis    Current Outpatient Prescriptions on File Prior to Visit  Medication Sig Dispense Refill  . EPINEPHrine 0.3 mg/0.3 mL IJ SOAJ injection Inject 0.3 mLs (0.3 mg total) into the muscle once.  1 Device 3  . Multiple Vitamins-Minerals (MULTIVITAMIN PO) Take by mouth daily. [employer] MENS MVI    . valACYclovir (VALTREX) 1000 MG tablet Take 1 tablet (1,000 mg total) by mouth daily. 90 tablet 3  . valACYclovir (VALTREX) 1000 MG tablet TAKE ONE TABLET BY MOUTH ONCE DAILY 30 tablet 0   No current facility-administered medications on file prior to visit.     BP 134/84   Pulse 73   Temp 98.3 F (36.8 C) (Oral)   Resp 19   Ht 5\' 9"  (1.753 m)   Wt 218 lb (98.9 kg)   SpO2 99%   BMI 32.19 kg/m       Objective:   Physical Exam  General Mental Status- Alert. General Appearance- Not in acute distress.      HEENT Head- Normal. Ear Auditory Canal - Left- Normal. Right - Normal.Tympanic Membrane- Left- Normal. Right- Normal. Eye Sclera/Conjunctiva- Left- Normal. Right- Normal. Nose  & Sinuses Nasal Mucosa- Left-  Not  Boggy and Congested. Right-  Not  Boggy and  Congested.Bilateral no  maxillary and no frontal sinus pressure. Mouth & Throat Lips: Upper Lip- Normal: no dryness, cracking, pallor, cyanosis, or vesicular eruption. Lower Lip-Normal: no dryness, cracking, pallor, cyanosis or vesicular eruption. Buccal Mucosa- Bilateral- No Aphthous ulcers. Oropharynx- No Discharge or Erythema. Tonsils: Characteristics- Bilateral- No Erythema or Congestion. Size/Enlargement- Bilateral- No enlargement. Discharge- bilateral-None.  Neck Neck- Supple. No Masses.   Lymphatic Head & Neck General Head & Neck Lymphatics: Bilateral: Description- No Localized lymphadenopathy.   Skin General: Color- Normal Color. Moisture- Normal Moisture. No worrisome lesions. Neck Carotid Arteries- Normal color. Moisture- Normal Moisture. No carotid bruits. No JVD.  Chest and Lung Exam Auscultation: Breath Sounds:-Normal.  Cardiovascular Auscultation:Rythm- Regular. Murmurs & Other Heart Sounds:Auscultation of the heart reveals- No Murmurs.  Abdomen Inspection:-Inspeection Normal. Palpation/Percussion:Note:No mass. Palpation and Percussion of the abdomen reveal- Non Tender, Non Distended + BS, no rebound or guarding. Very tiny umbilical hernia last year not real obvious this year.. On straight leg lifts protrudes very minimally.   Neurologic Cranial Nerve exam:- CN III-XII intact(No nystagmus), symmetric smile. Strength:- 5/5 equal and symmetric strength both upper and lower extremities.  Genital exam- no inguinal hernia. No obvious lesions(2-3 herpes break outs over last year) one healed lesion left side of penis beneath glands.      Assessment & Plan:  For you wellness exam today I have ordered cbc, cmp, , lipid panel, ua and hiv.   Vaccine tdap delayed will get sometime next weeks.  Recommend exercise and healthy diet.  We will let you know lab results as they come  in.  Follow up date appointment will be determined after lab review.   Will rx preventative valtrex for herpes.   Senai Ramnath, Percell Miller, PA-C

## 2016-11-08 NOTE — Patient Instructions (Addendum)
For you wellness exam today I have ordered cbc, cmp, , lipid panel, ua and hiv.   Vaccine tdap delayed will get sometime next weeks.  Recommend exercise and healthy diet.  We will let you know lab results as they come in.  Follow up date appointment will be determined after lab review.   Will rx preventative valtrex for herpes.   Preventive Care 18-39 Years, Male Preventive care refers to lifestyle choices and visits with your health care provider that can promote health and wellness. What does preventive care include?  A yearly physical exam. This is also called an annual well check.  Dental exams once or twice a year.  Routine eye exams. Ask your health care provider how often you should have your eyes checked.  Personal lifestyle choices, including: ? Daily care of your teeth and gums. ? Regular physical activity. ? Eating a healthy diet. ? Avoiding tobacco and drug use. ? Limiting alcohol use. ? Practicing safe sex. What happens during an annual well check? The services and screenings done by your health care provider during your annual well check will depend on your age, overall health, lifestyle risk factors, and family history of disease. Counseling Your health care provider may ask you questions about your:  Alcohol use.  Tobacco use.  Drug use.  Emotional well-being.  Home and relationship well-being.  Sexual activity.  Eating habits.  Work and work Statistician.  Screening You may have the following tests or measurements:  Height, weight, and BMI.  Blood pressure.  Lipid and cholesterol levels. These may be checked every 5 years starting at age 73.  Diabetes screening. This is done by checking your blood sugar (glucose) after you have not eaten for a while (fasting).  Skin check.  Hepatitis C blood test.  Hepatitis B blood test.  Sexually transmitted disease (STD) testing.  Discuss your test results, treatment options, and if necessary,  the need for more tests with your health care provider. Vaccines Your health care provider may recommend certain vaccines, such as:  Influenza vaccine. This is recommended every year.  Tetanus, diphtheria, and acellular pertussis (Tdap, Td) vaccine. You may need a Td booster every 10 years.  Varicella vaccine. You may need this if you have not been vaccinated.  HPV vaccine. If you are 71 or younger, you may need three doses over 6 months.  Measles, mumps, and rubella (MMR) vaccine. You may need at least one dose of MMR.You may also need a second dose.  Pneumococcal 13-valent conjugate (PCV13) vaccine. You may need this if you have certain conditions and have not been vaccinated.  Pneumococcal polysaccharide (PPSV23) vaccine. You may need one or two doses if you smoke cigarettes or if you have certain conditions.  Meningococcal vaccine. One dose is recommended if you are age 78-21 years and a first-year college student living in a residence hall, or if you have one of several medical conditions. You may also need additional booster doses.  Hepatitis A vaccine. You may need this if you have certain conditions or if you travel or work in places where you may be exposed to hepatitis A.  Hepatitis B vaccine. You may need this if you have certain conditions or if you travel or work in places where you may be exposed to hepatitis B.  Haemophilus influenzae type b (Hib) vaccine. You may need this if you have certain risk factors.  Talk to your health care provider about which screenings and vaccines you need and how  often you need them. This information is not intended to replace advice given to you by your health care provider. Make sure you discuss any questions you have with your health care provider. Document Released: 06/12/2001 Document Revised: 01/04/2016 Document Reviewed: 02/15/2015 Elsevier Interactive Patient Education  2017 [employer].

## 2016-11-08 NOTE — Telephone Encounter (Signed)
Pt may get tdap in future. But he has possible allergy to eggs. Is there contraindication with egg allergy and tdap. Wanted to know this before he comes in for tdap sometime within a week?

## 2016-11-09 ENCOUNTER — Telehealth: Payer: Self-pay | Admitting: Medical

## 2016-11-09 LAB — HIV ANTIBODY (ROUTINE TESTING W REFLEX): HIV: NONREACTIVE

## 2016-11-09 NOTE — Telephone Encounter (Signed)
Accidentally place this question in wrong note. I did find answer. Can you delete response from his chart. Thanks for investigating.

## 2016-11-09 NOTE — Telephone Encounter (Signed)
Per the CDC:  Tdap vaccine (Combined Tetanus, Diphtheria & Pertussis) Some people should not get this vaccine. A person who has ever had a life-threatening allergic reaction after a previous dose of any diphtheria, tetanus or pertussis containing vaccine, OR has a severe allergy to any part of this vaccine, should not get Tdap vaccine. Tell the person giving the vaccine about any severe allergies.  Anyone who had coma or long repeated seizures within 7 days after a childhood dose of DTP or DTaP, or a previous dose of Tdap, should not get Tdap, unless a cause other than the vaccine was found. They can still get Td.  Talk to your doctor if you:  have seizures or another nervous system problem,  had severe pain or swelling after any vaccine containing diphtheria, tetanus or pertussis,  ever had a condition called Guillain Barr Syndrome (GBS),  aren't feeling well on the day the shot is scheduled. This information was taken directly from the Tdap VIS  Tetanus, Diphtheria, and Pertussis (Tdap) Vaccines There are two Tdap vaccines used in the [place] States: Adacel and Boostrix.  Each 0.5-mL dose of Adacel (Sanofi Pasteur) contains 5 Lf tetanus toxoid, 2 Lf diphtheria toxoid, and acellular pertussis antigens (2.5 g detoxified PT, 5 g FHA, 3 g pertactin, 5 g FIM). Other ingredients per 0.5-mL dose include 1.5 mg aluminum phosphate (0.33 mg aluminum) as the adjuvant, ?5 g residual formaldehyde, <50 ng residual glutaraldehyde, and 3.3 mg (0.6% v/v) 2-phenoxyethanol (not as a preservative).  Each 0.5-mL dose of Boostrix (GlaxoSmithKline) is formulated to contain 5 Lf of tetanus toxoid, 2.5 Lf of diphtheria toxoid, 8 g of inactivated PT, 8 g of FHA, and 2.5 g of pertactin (69 kiloDalton outer membrane protein). Each 0.5-mL dose contains aluminum hydroxide as adjuvant (not more than 0.39 mg aluminum by assay), 4.5 mg of sodium chloride, ?100 g of residual formaldehyde, and ?100 g of polysorbate  80 (Tween 80).

## 2016-11-10 ENCOUNTER — Telehealth: Payer: Self-pay | Admitting: Medical

## 2016-11-10 DIAGNOSIS — Z0000 Encounter for general adult medical examination without abnormal findings: Secondary | ICD-10-CM

## 2016-11-10 MED ORDER — ATORVASTATIN CALCIUM 10 MG PO TABS: 10.0000 mg | ORAL_TABLET | Freq: Every day | ORAL | 3 refills | Status: DC

## 2016-11-10 NOTE — Telephone Encounter (Signed)
Future lipid panel placed. 

## 2016-11-27 ENCOUNTER — Other Ambulatory Visit (INDEPENDENT_AMBULATORY_CARE_PROVIDER_SITE_OTHER): Payer: Managed Care, Other (non HMO)

## 2016-11-27 DIAGNOSIS — Z0000 Encounter for general adult medical examination without abnormal findings: Secondary | ICD-10-CM | POA: Diagnosis not present

## 2016-11-27 LAB — COMPREHENSIVE METABOLIC PANEL
ALT: 43 U/L (ref 0–53)
AST: 40 U/L — AB (ref 0–37)
Albumin: 4.5 g/dL (ref 3.5–5.2)
Alkaline Phosphatase: 38 U/L — ABNORMAL LOW (ref 39–117)
BILIRUBIN TOTAL: 0.8 mg/dL (ref 0.2–1.2)
BUN: 13 mg/dL (ref 6–23)
CHLORIDE: 103 meq/L (ref 96–112)
CO2: 30 meq/L (ref 19–32)
CREATININE: 1.27 mg/dL (ref 0.40–1.50)
Calcium: 10 mg/dL (ref 8.4–10.5)
GFR: 79.39 mL/min (ref >60.00)
GLUCOSE: 101 mg/dL — AB (ref 70–99)
Potassium: 4 mEq/L (ref 3.5–5.1)
SODIUM: 139 meq/L (ref 135–145)
Total Protein: 7.5 g/dL (ref 6.0–8.3)

## 2017-09-18 ENCOUNTER — Telehealth: Payer: Self-pay | Admitting: Medical

## 2017-09-18 NOTE — Telephone Encounter (Signed)
Spoke with Ray Lopez. Patient is not eligible to receive AWV because he does not have Medicare insurance. Explained to patient that he can schedule his yearly physical since he has [employer]. Patient stated that he wanted to schedule his physical for 10/2017. Patient has been scheduled with his provider. SF

## 2017-09-18 NOTE — Telephone Encounter (Signed)
Copied from [place] [id number]. Topic: Quick Communication - See Telephone Encounter >> Sep 18, 2017 12:30 PM Cleaster Corin, NT wrote: CRM for notification. See Telephone encounter for: 09/18/17.  Pt. Calling to schedule MWV.

## 2017-11-11 ENCOUNTER — Ambulatory Visit (INDEPENDENT_AMBULATORY_CARE_PROVIDER_SITE_OTHER): Payer: Managed Care, Other (non HMO) | Admitting: Medical

## 2017-11-11 ENCOUNTER — Encounter: Payer: Self-pay | Admitting: Medical

## 2017-11-11 VITALS — BP 120/84 | HR 57 | Temp 98.4°F | Resp 16 | Ht 69.0 in | Wt 220.6 lb

## 2017-11-11 DIAGNOSIS — Z23 Encounter for immunization: Secondary | ICD-10-CM | POA: Diagnosis not present

## 2017-11-11 DIAGNOSIS — R252 Cramp and spasm: Secondary | ICD-10-CM

## 2017-11-11 DIAGNOSIS — Z125 Encounter for screening for malignant neoplasm of prostate: Secondary | ICD-10-CM

## 2017-11-11 DIAGNOSIS — R972 Elevated prostate specific antigen [PSA]: Secondary | ICD-10-CM

## 2017-11-11 DIAGNOSIS — Z0000 Encounter for general adult medical examination without abnormal findings: Secondary | ICD-10-CM

## 2017-11-11 LAB — COMPREHENSIVE METABOLIC PANEL
ALT: 22 U/L (ref 0–53)
AST: 21 U/L (ref 0–37)
Albumin: 4.5 g/dL (ref 3.5–5.2)
Alkaline Phosphatase: 36 U/L — ABNORMAL LOW (ref 39–117)
BILIRUBIN TOTAL: 0.7 mg/dL (ref 0.2–1.2)
BUN: 14 mg/dL (ref 6–23)
CALCIUM: 9.8 mg/dL (ref 8.4–10.5)
CHLORIDE: 104 meq/L (ref 96–112)
CO2: 31 meq/L (ref 19–32)
Creatinine, Ser: 1.26 mg/dL (ref 0.40–1.50)
GFR: 79.76 mL/min (ref >60.00)
Glucose, Bld: 101 mg/dL — ABNORMAL HIGH (ref 70–99)
Potassium: 5.1 mEq/L (ref 3.5–5.1)
Sodium: 141 mEq/L (ref 135–145)
Total Protein: 6.9 g/dL (ref 6.0–8.3)

## 2017-11-11 LAB — POC URINALSYSI DIPSTICK (AUTOMATED)
BILIRUBIN UA: NEGATIVE
GLUCOSE UA: NEGATIVE
Ketones, UA: NEGATIVE
Leukocytes, UA: NEGATIVE
NITRITE UA: NEGATIVE
Protein, UA: NEGATIVE
RBC UA: NEGATIVE
Spec Grav, UA: 1.015 (ref 1.010–1.025)
Urobilinogen, UA: NEGATIVE E.U./dL — AB
pH, UA: 6 (ref 5.0–8.0)

## 2017-11-11 LAB — LIPID PANEL
Cholesterol: 179 mg/dL (ref 0–200)
HDL: 54.6 mg/dL (ref >39.00)
LDL CALC: 100 mg/dL — AB (ref 0–99)
NonHDL: 124.05
TRIGLYCERIDES: 118 mg/dL (ref 0.0–149.0)
Total CHOL/HDL Ratio: 3
VLDL: 23.6 mg/dL (ref 0.0–40.0)

## 2017-11-11 LAB — CBC
HEMATOCRIT: 45.1 % (ref 39.0–52.0)
HEMOGLOBIN: 15.4 g/dL (ref 13.0–17.0)
MCHC: 34.1 g/dL (ref 30.0–36.0)
MCV: 95.1 fl (ref 78.0–100.0)
Platelets: 247 10*3/uL (ref 150.0–400.0)
RBC: 4.74 Mil/uL (ref 4.22–5.81)
RDW: 13.9 % (ref 11.5–15.5)
WBC: 3.4 10*3/uL — ABNORMAL LOW (ref 4.0–10.5)

## 2017-11-11 LAB — MAGNESIUM: Magnesium: 2.2 mg/dL (ref 1.5–2.5)

## 2017-11-11 LAB — PSA: PSA: 0.86 ng/mL (ref 0.10–4.00)

## 2017-11-11 NOTE — Progress Notes (Signed)
Subjective:    Patient ID: Ray Lopez, male    DOB: Oct 16, 1975, 42 y.o.   MRN: [id number]  HPI  Pt in for CPE.  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 He states job is stressful job(no job change). Travels a lot. Weekends are at home. Pt still working out 3 times a week. Eating  Healthier on the road(not eating past 7 pm). He thinks was drinking 2 beers a day last year now only on weekends. Decrease from last visit.   Married- one daughter.(vasectomy in 2013)  Pt ok with give tdap.  No family history of prostate cancer   Review of Systems  Constitutional: Negative for chills, fatigue and fever.  Eyes:       Occasional rare rt upper eye lid twitch.  Respiratory: Negative for cough, chest tightness, shortness of breath and wheezing.   Cardiovascular: Negative for chest pain and palpitations.  Gastrointestinal: Negative for abdominal pain.  Genitourinary: Negative for decreased urine volume, difficulty urinating, frequency, hematuria and penile pain.  Musculoskeletal: Negative for back pain.  Skin: Negative for rash.  Neurological: Negative for dizziness, seizures, speech difficulty, weakness and headaches.  Hematological: Negative for adenopathy. Does not bruise/bleed easily.  Psychiatric/Behavioral: Negative for behavioral problems, confusion, sleep disturbance and suicidal ideas. The patient is not nervous/anxious.     Past Medical History:  Diagnosis Date  . Allergic rhinitis   . Asthma   . Blood pressure elevated without history of HTN   . Chicken pox   . Genital herpes   . Low testosterone      Social History   Socioeconomic History  . Marital status: Married    Spouse name: Not on file  . Number of children: Not on file  . Years of education: Not on file  . Highest education level: Not on file  Occupational History  . Not on file  Social Needs  . Financial resource strain: Not on file  . Food insecurity:    Worry: Not on file    Inability: Not on  file  . Transportation needs:    Medical: Not on file    Non-medical: Not on file  Tobacco Use  . Smoking status: Never Smoker  . Smokeless tobacco: Never Used  Substance and Sexual Activity  . Alcohol use: Yes    Alcohol/week: 3.6 oz    Types: 6 Cans of beer per week  . Drug use: No  . Sexual activity: Yes    Birth control/protection: Surgical  Lifestyle  . Physical activity:    Days per week: Not on file    Minutes per session: Not on file  . Stress: Not on file  Relationships  . Social connections:    Talks on phone: Not on file    Gets together: Not on file    Attends religious service: Not on file    Active member of club or organization: Not on file    Attends meetings of clubs or organizations: Not on file    Relationship status: Not on file  . Intimate partner violence:    Fear of current or ex partner: Not on file    Emotionally abused: Not on file    Physically abused: Not on file    Forced sexual activity: Not on file  Other Topics Concern  . 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 REFRACTIVE SURGERY  4/13   lasix eye surgery  . VASECTOMY  10/2010    Family History  Problem Relation Age of Onset  . Diabetes Maternal Grandmother   . Hypertension Mother        Living  . Hyperlipidemia Mother   . Diverticulitis Mother   . Healthy Father        Living  . Diabetes Brother   . Healthy Sister   . Allergies Daughter   . Coronary artery disease Neg Hx   . Stroke Neg Hx   . Breast cancer Neg Hx   . Colon cancer Neg Hx   . Prostate cancer Neg Hx     Allergies  Allergen Reactions  . Shellfish Allergy Anaphylaxis    Current Outpatient Medications on File Prior to Visit  Medication Sig Dispense Refill  . EPINEPHrine 0.3 mg/0.3 mL IJ SOAJ injection Inject 0.3 mLs (0.3 mg total) into the muscle once. 1 Device 3  .  Multiple Vitamins-Minerals (MULTIVITAMIN PO) Take by mouth daily. [employer] MENS MVI    . valACYclovir (VALTREX) 1000 MG tablet TAKE ONE TABLET BY MOUTH ONCE DAILY 30 tablet 0  . valACYclovir (VALTREX) 1000 MG tablet Take 1 tablet (1,000 mg total) by mouth daily. 90 tablet 3  . atorvastatin (LIPITOR) 10 MG tablet Take 1 tablet (10 mg total) by mouth daily. (Patient not taking: Reported on 11/11/2017) 30 tablet 3   No current facility-administered medications on file prior to visit.     BP 120/84   Pulse (!) 57   Temp 98.4 F (36.9 C) (Oral)   Resp 16   Ht 5\' 9"  (1.753 m)   Wt 220 lb 9.6 oz (100.1 kg)   SpO2 100%   BMI 32.58 kg/m       Objective:   Physical Exam  General Mental Status- Alert. General Appearance- Not in acute distress.    HEENT Head- Normal. Ear Auditory Canal - Left- Normal. Right - Normal.Tympanic Membrane- Left- Normal. Right- Normal. Eye Sclera/Conjunctiva- Left- Normal. Right- Normal. Nose & Sinuses Nasal Mucosa- Left-  Not  Boggy and Congested. Right-  Not  Boggy and  Congested.Bilateral no  maxillary and no frontal sinus pressure. Mouth & Throat Lips: Upper Lip- Normal: no dryness, cracking, pallor, cyanosis, or vesicular eruption. Lower Lip-Normal: no dryness, cracking, pallor, cyanosis or vesicular eruption. Buccal Mucosa- Bilateral- No Aphthous ulcers. Oropharynx- No Discharge or Erythema. Tonsils: Characteristics- Bilateral- No Erythema or Congestion. Size/Enlargement- Bilateral- No enlargement. Discharge- bilateral-None.  Neck Neck- Supple. No Masses.   Lymphatic Head & Neck General Head & Neck Lymphatics: Bilateral: Description- No Localized lymphadenopathy.   Skin General: Color- Normal Color. Moisture- Normal Moisture. No worrisome lesions. Neck Carotid Arteries- Normal color. Moisture- Normal Moisture. No carotid bruits. No JVD.  Chest and Lung Exam Auscultation: Breath Sounds:-Normal.  Cardiovascular Auscultation:Rythm-  Regular. Murmurs & Other Heart Sounds:Auscultation of the heart reveals- No Murmurs.  Abdomen Inspection:-Inspeection Normal. Palpation/Percussion:Note:No mass. Palpation and Percussion of the abdomen reveal- Non Tender, Non Distended + BS, no rebound or guarding. Very tiny umbilical hernia last year not real obvious this year.. On straight leg lifts protrudes very minimally.   Neurologic Cranial Nerve exam:- CN III-XII intact(No nystagmus), symmetric smile. Strength:- 5/5 equal and symmetric strength both upper and lower extremities.      Assessment & Plan:  For you wellness exam today I have ordered cbc, cmp, lipid panel,  psa, and ua.  Vaccine given today  tdap today.    Recommend exercise and healthy diet.  We will let you know lab results as they come in.  Follow up date appointment will be determined after lab review.  Mackie Pai, PA-C

## 2017-11-11 NOTE — Patient Instructions (Addendum)
For you wellness exam today I have ordered cbc, cmp, lipid panel,  psa, and ua.  Vaccine given today tdap today.    Recommend exercise and healthy diet.  We will let you know lab results as they come in.  Follow up date appointment will be determined after lab review.    Preventive Care 40-64 Years, Male Preventive care refers to lifestyle choices and visits with your health care provider that can promote health and wellness. What does preventive care include?  A yearly physical exam. This is also called an annual well check.  Dental exams once or twice a year.  Routine eye exams. Ask your health care provider how often you should have your eyes checked.  Personal lifestyle choices, including: ? Daily care of your teeth and gums. ? Regular physical activity. ? Eating a healthy diet. ? Avoiding tobacco and drug use. ? Limiting alcohol use. ? Practicing safe sex. ? Taking low-dose aspirin every day starting at age 65. What happens during an annual well check? The services and screenings done by your health care provider during your annual well check will depend on your age, overall health, lifestyle risk factors, and family history of disease. Counseling Your health care provider may ask you questions about your:  Alcohol use.  Tobacco use.  Drug use.  Emotional well-being.  Home and relationship well-being.  Sexual activity.  Eating habits.  Work and work Statistician.  Screening You may have the following tests or measurements:  Height, weight, and BMI.  Blood pressure.  Lipid and cholesterol levels. These may be checked every 5 years, or more frequently if you are over 69 years old.  Skin check.  Lung cancer screening. You may have this screening every year starting at age 63 if you have a 30-pack-year history of smoking and currently smoke or have quit within the past 15 years.  Fecal occult blood test (FOBT) of the stool. You may have this test every  year starting at age 66.  Flexible sigmoidoscopy or colonoscopy. You may have a sigmoidoscopy every 5 years or a colonoscopy every 10 years starting at age 48.  Prostate cancer screening. Recommendations will vary depending on your family history and other risks.  Hepatitis C blood test.  Hepatitis B blood test.  Sexually transmitted disease (STD) testing.  Diabetes screening. This is done by checking your blood sugar (glucose) after you have not eaten for a while (fasting). You may have this done every 1-3 years.  Discuss your test results, treatment options, and if necessary, the need for more tests with your health care provider. Vaccines Your health care provider may recommend certain vaccines, such as:  Influenza vaccine. This is recommended every year.  Tetanus, diphtheria, and acellular pertussis (Tdap, Td) vaccine. You may need a Td booster every 10 years.  Varicella vaccine. You may need this if you have not been vaccinated.  Zoster vaccine. You may need this after age 64.  Measles, mumps, and rubella (MMR) vaccine. You may need at least one dose of MMR if you were born in 1957 or later. You may also need a second dose.  Pneumococcal 13-valent conjugate (PCV13) vaccine. You may need this if you have certain conditions and have not been vaccinated.  Pneumococcal polysaccharide (PPSV23) vaccine. You may need one or two doses if you smoke cigarettes or if you have certain conditions.  Meningococcal vaccine. You may need this if you have certain conditions.  Hepatitis A vaccine. You may need this if  you have certain conditions or if you travel or work in places where you may be exposed to hepatitis A.  Hepatitis B vaccine. You may need this if you have certain conditions or if you travel or work in places where you may be exposed to hepatitis B.  Haemophilus influenzae type b (Hib) vaccine. You may need this if you have certain risk factors.  Talk to your health care  provider about which screenings and vaccines you need and how often you need them. This information is not intended to replace advice given to you by your health care provider. Make sure you discuss any questions you have with your health care provider. Document Released: 05/13/2015 Document Revised: 01/04/2016 Document Reviewed: 02/15/2015 Elsevier Interactive Patient Education  [employer].

## 2018-02-16 ENCOUNTER — Encounter: Payer: Self-pay | Admitting: Medical

## 2018-02-18 ENCOUNTER — Encounter: Payer: Self-pay | Admitting: Medical

## 2018-02-18 ENCOUNTER — Ambulatory Visit (INDEPENDENT_AMBULATORY_CARE_PROVIDER_SITE_OTHER): Payer: Managed Care, Other (non HMO) | Admitting: Medical

## 2018-02-18 VITALS — BP 125/89 | HR 62 | Temp 98.5°F | Resp 16 | Ht 69.0 in | Wt 225.6 lb

## 2018-02-18 DIAGNOSIS — R0683 Snoring: Secondary | ICD-10-CM | POA: Diagnosis not present

## 2018-02-18 NOTE — Patient Instructions (Signed)
For snoring and suspicion of sleep apnea, I am going to refer you to pulmonologist. I will ask if they can get you in quickly in light of described symptoms.  If you don't get call within 7-10 days please my chart me so can follow up.  Follow up date to be determined.

## 2018-02-18 NOTE — Progress Notes (Signed)
Subjective:    Patient ID: Ray Lopez, male    DOB: 07/26/1975, 42 y.o.   MRN: [id number]  HPI  Pt in for follow up.  Pt states that he wakes up at times with severe snoring and describes at times gagging. Pt notes this has happened twice. Wife almost states nightly. This is going on for about 3-4 months.  Pt has been gaining wait since 2016. Used to weight 208 in 2016 lb.  Pt admits feeling tired after mid-day.   Pt states snored for long time for years.     Review of Systems  Constitutional: Negative for chills, fatigue and fever.  HENT:       Snores. See hpi.  Respiratory: Negative for cough, chest tightness, shortness of breath and wheezing.   Cardiovascular: Negative for chest pain and palpitations.  Gastrointestinal: Negative for abdominal pain.  Genitourinary: Negative for dysuria and frequency.  Musculoskeletal: Negative for back pain.  Skin: Negative for rash.  Neurological: Negative for dizziness, seizures, weakness, light-headedness and headaches.  Hematological: Negative for adenopathy. Does not bruise/bleed easily.  Psychiatric/Behavioral: Negative for behavioral problems and confusion.    Past Medical History:  Diagnosis Date  . Allergic rhinitis   . Asthma   . Blood pressure elevated without history of HTN   . Chicken pox   . Genital herpes   . Low testosterone      Social History   Socioeconomic History  . Marital status: Married    Spouse name: Not on file  . Number of children: Not on file  . Years of education: Not on file  . Highest education level: Not on file  Occupational History  . Not on file  Social Needs  . Financial resource strain: Not on file  . Food insecurity:    Worry: Not on file    Inability: Not on file  . Transportation needs:    Medical: Not on file    Non-medical: Not on file  Tobacco Use  . Smoking status: Never Smoker  . Smokeless tobacco: Never Used  Substance and Sexual Activity  . Alcohol use: Yes      Alcohol/week: 6.0 standard drinks    Types: 6 Cans of beer per week  . Drug use: No  . Sexual activity: Yes    Birth control/protection: Surgical  Lifestyle  . Physical activity:    Days per week: Not on file    Minutes per session: Not on file  . Stress: Not on file  Relationships  . Social connections:    Talks on phone: Not on file    Gets together: Not on file    Attends religious service: Not on file    Active member of club or organization: Not on file    Attends meetings of clubs or organizations: Not on file    Relationship status: Not on file  . Intimate partner violence:    Fear of current or ex partner: Not on file    Emotionally abused: Not on file    Physically abused: Not on file    Forced sexual activity: Not on file  Other Topics Concern  . Not on file  Social History Narrative   Freight forwarder for [employer]   Married over 18 years   97 yr old daughter          Past Surgical History:  Procedure Laterality Date  . REFRACTIVE SURGERY  4/13   lasix eye surgery  . VASECTOMY  10/2010  Family History  Problem Relation Age of Onset  . Diabetes Maternal Grandmother   . Hypertension Mother        Living  . Hyperlipidemia Mother   . Diverticulitis Mother   . Healthy Father        Living  . Diabetes Brother   . Healthy Sister   . Allergies Daughter   . Coronary artery disease Neg Hx   . Stroke Neg Hx   . Breast cancer Neg Hx   . Colon cancer Neg Hx   . Prostate cancer Neg Hx     Allergies  Allergen Reactions  . Shellfish Allergy Anaphylaxis    Current Outpatient Medications on File Prior to Visit  Medication Sig Dispense Refill  . atorvastatin (LIPITOR) 10 MG tablet Take 1 tablet (10 mg total) by mouth daily. 30 tablet 3  . EPINEPHrine 0.3 mg/0.3 mL IJ SOAJ injection Inject 0.3 mLs (0.3 mg total) into the muscle once. 1 Device 3  . Multiple Vitamins-Minerals (MULTIVITAMIN PO) Take by mouth daily. [employer] MENS MVI    . valACYclovir (VALTREX) 1000  MG tablet TAKE ONE TABLET BY MOUTH ONCE DAILY 30 tablet 0  . valACYclovir (VALTREX) 1000 MG tablet Take 1 tablet (1,000 mg total) by mouth daily. 90 tablet 3   No current facility-administered medications on file prior to visit.     BP 125/89   Pulse 62   Temp 98.5 F (36.9 C) (Oral)   Resp 16   Ht 5\' 9"  (1.753 m)   Wt 225 lb 9.6 oz (102.3 kg)   SpO2 100%   BMI 33.32 kg/m       Objective:   Physical Exam  General  Mental Status - Alert. General Appearance - Well groomed. Not in acute distress.  Skin Rashes- No Rashes.  HEENT Head- Normal. Ear Auditory Canal - Left- Normal. Right - Normal.Tympanic Membrane- Left- Normal. Right- Normal. Eye Sclera/Conjunctiva- Left- Normal. Right- Normal. Nose & Sinuses Nasal Mucosa- Left-  Boggy and Congested. Right-  Boggy and  Congested.Bilateral no maxillary and  No frontal sinus pressure. Mouth & Throat Lips: Upper Lip- Normal: no dryness, cracking, pallor, cyanosis, or vesicular eruption. Lower Lip-Normal: no dryness, cracking, pallor, cyanosis or vesicular eruption. Buccal Mucosa- Bilateral- No Aphthous ulcers. Oropharynx- No Discharge or Erythema. Tonsils: Characteristics- Bilateral- No Erythema or Congestion. Size/Enlargement- Bilateral- No enlargement. Discharge- bilateral-None.  Neck Neck- Supple. No Masses.   Chest and Lung Exam Auscultation: Breath Sounds:-Clear even and unlabored.  Cardiovascular Auscultation:Rythm- Regular, rate and rhythm. Murmurs & Other Heart Sounds:Ausculatation of the heart reveal- No Murmurs.  Lymphatic Head & Neck General Head & Neck Lymphatics: Bilateral: Description- No Localized lymphadenopathy.       Assessment & Plan:  For snoring and suspicion of sleep apnea, I am going to refer you to pulmonologist. I will ask if they can get you in quickly in light of described symptoms.  If you don't get call within 7-10 days please my chart me so can follow up.  Follow up date to be  determined.  Mackie Pai, PA-C

## 2018-02-28 ENCOUNTER — Ambulatory Visit (INDEPENDENT_AMBULATORY_CARE_PROVIDER_SITE_OTHER): Payer: Managed Care, Other (non HMO) | Admitting: Internal Medicine

## 2018-02-28 ENCOUNTER — Encounter: Payer: Self-pay | Admitting: Internal Medicine

## 2018-02-28 VITALS — BP 128/88 | HR 62 | Ht 70.0 in | Wt 224.6 lb

## 2018-02-28 DIAGNOSIS — G4733 Obstructive sleep apnea (adult) (pediatric): Secondary | ICD-10-CM | POA: Diagnosis not present

## 2018-02-28 NOTE — Progress Notes (Signed)
02/28/2018-42 year old male never smoker for sleep evaluation. Referred courtesy of PA-C Saguier. -----States he snores and he is waking up gasping for air. States his snoring is chronic but the gasping for air has developed over the last 6  months. States he travles 60% of the time for his job. Epworth is 9.  Problem list includes allergic rhinitis, BPH, Epworth score 9 To me he describes waking up choking after being asleep about 2 hours.  Loud snoring wakes him.  Wife wakes him because of witnessed apneas.  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  Sleep disturbed some by different locations and by work calls at night. Asthma as a teenager with little breakthrough now.  No ENT surgery.  Frequent rhinitis and he "depends on nasal saline rinse" Some daytime sleepiness.  3 cups of coffee in the morning and occasional energy drink.  Prior to Admission medications   Medication Sig Start Date End Date Taking? Authorizing Provider  EPINEPHrine 0.3 mg/0.3 mL IJ SOAJ injection Inject 0.3 mLs (0.3 mg total) into the muscle once. 09/12/15  Yes Brunetta Jeans, PA-C  Multiple Vitamins-Minerals (MULTIVITAMIN PO) Take by mouth daily. [employer] MENS MVI   Yes [provider]  valACYclovir (VALTREX) 1000 MG tablet TAKE ONE TABLET BY MOUTH ONCE DAILY 12/15/15  Yes Saguier, Percell Miller, PA-C  valACYclovir (VALTREX) 1000 MG tablet Take 1 tablet (1,000 mg total) by mouth daily. 11/08/16  Yes Saguier, Iris Pert   Past Medical History:  Diagnosis Date  . Allergic rhinitis   . Asthma   . Blood pressure elevated without history of HTN   . Chicken pox   . Genital herpes   . Low testosterone    Past Surgical History:  Procedure Laterality Date  . REFRACTIVE SURGERY  4/13   lasix eye surgery  . VASECTOMY  10/2010   Family History  Problem Relation Age of Onset  . Diabetes Maternal Grandmother   . Hypertension Mother        Living  . Hyperlipidemia Mother   .  Diverticulitis Mother   . Healthy Father        Living  . Diabetes Brother   . Healthy Sister   . Allergies Daughter   . Coronary artery disease Neg Hx   . Stroke Neg Hx   . Breast cancer Neg Hx   . Colon cancer Neg Hx   . Prostate cancer Neg Hx    Social History   Socioeconomic History  . Marital status: Married    Spouse name: Not on file  . Number of children: Not on file  . Years of education: Not on file  . Highest education level: Not on file  Occupational History  . Not on file  Social Needs  . Financial resource strain: Not on file  . Food insecurity:    Worry: Not on file    Inability: Not on file  . Transportation needs:    Medical: Not on file    Non-medical: Not on file  Tobacco Use  . Smoking status: Never Smoker  . Smokeless tobacco: Never Used  Substance and Sexual Activity  . Alcohol use: Yes    Alcohol/week: 6.0 standard drinks    Types: 6 Cans of beer per week  . Drug use: No  . Sexual activity: Yes    Birth control/protection: Surgical  Lifestyle  . Physical activity:    Days per week: Not on file  Minutes per session: Not on file  . Stress: Not on file  Relationships  . Social connections:    Talks on phone: Not on file    Gets together: Not on file    Attends religious service: Not on file    Active member of club or organization: Not on file    Attends meetings of clubs or organizations: Not on file    Relationship status: Not on file  . Intimate partner violence:    Fear of current or ex partner: Not on file    Emotionally abused: Not on file    Physically abused: Not on file    Forced sexual activity: Not on file  Other Topics Concern  . 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 HEENT:    headaches, difficulty swallowing, tooth/dental  problems, sore throat,       sneezing, itching, ear ache, +nasal congestion, post nasal drip, snoring CV:    chest pain, orthopnea, PND, swelling in lower extremities, anasarca,                                  dizziness, palpitations Resp:   shortness of breath with exertion or at rest.                productive cough,   non-productive cough, coughing up of blood.              change in color of mucus.  wheezing.   Skin:    rash or lesions. GI:  No-   heartburn, indigestion, abdominal pain, nausea, vomiting, diarrhea,                 change in bowel habits, loss of appetite GU: dysuria, change in color of urine, no urgency or frequency.   flank pain. MS:   joint pain, stiffness, decreased range of motion, back pain. Neuro-     nothing unusual Psych:  change in mood or affect.  depression or anxiety.   memory loss.  OBJ- Physical Exam General- Alert, Oriented, Affect-appropriate, Distress- none acute Skin- rash-none, lesions- none, excoriation- none Lymphadenopathy- none Head- atraumatic            Eyes- Gross vision intact, PERRLA, conjunctivae and secretions clear            Ears- Hearing, canals-normal            Nose- Clear, no-Septal dev, mucus, polyps, erosion, perforation             Throat- Mallampati II-III , mucosa clear , drainage- none, tonsils- atrophic Neck- flexible , trachea midline, no stridor , thyroid nl, carotid no bruit Chest - symmetrical excursion , unlabored           Heart/CV- RRR , no murmur , no gallop  , no rub, nl s1 s2                           - JVD- none , edema- none, stasis changes- none, varices- none           Lung- clear to P&A, wheeze- none, cough- none , dullness-none, rub-  none           Chest wall-  Abd-  Br/ Gen/ Rectal- Not done, not indicated Extrem- cyanosis- none, clubbing, none, atrophy- none, strength- nl Neuro- grossly intact to observation

## 2018-02-28 NOTE — Patient Instructions (Signed)
Order- please schedule unattended home sleep test    Dx OSA  Please call me about 2 weeks after your sleep study, for results and recommendations. If appropriate, we may be able to start treatment before I see you back.

## 2018-03-03 DIAGNOSIS — G4733 Obstructive sleep apnea (adult) (pediatric): Secondary | ICD-10-CM | POA: Insufficient documentation

## 2018-03-03 NOTE — Assessment & Plan Note (Addendum)
Tentative diagnosis based on history and physical.  Pharyngeal spacing actually looks pretty good anteriorly.  We discussed the medical concerns of untreated sleep apnea, basic sleep hygiene, responsibility to be alert and safe while driving, testing and treatments.  With his frequent travel, travel CPAP or a fitted oral appliance might be choices. Plan-schedule sleep study

## 2018-03-13 NOTE — Telephone Encounter (Signed)
[facility] please advise on this, thank you.

## 2018-03-22 DIAGNOSIS — G4733 Obstructive sleep apnea (adult) (pediatric): Secondary | ICD-10-CM

## 2018-03-25 ENCOUNTER — Other Ambulatory Visit: Payer: Self-pay | Admitting: *Deleted

## 2018-03-25 DIAGNOSIS — G4733 Obstructive sleep apnea (adult) (pediatric): Secondary | ICD-10-CM

## 2018-04-04 DIAGNOSIS — G4733 Obstructive sleep apnea (adult) (pediatric): Secondary | ICD-10-CM | POA: Diagnosis not present

## 2018-04-14 ENCOUNTER — Telehealth: Payer: Self-pay | Admitting: Internal Medicine

## 2018-04-14 NOTE — Telephone Encounter (Signed)
Called and spoke with patient he is requesting to have sleep study results. CY please advise, thank you.

## 2018-04-16 NOTE — Telephone Encounter (Signed)
HST completed 03/22/18. Pt is requesting the results. Dr. Annamaria Boots, please advise. Thanks!

## 2018-04-17 NOTE — Telephone Encounter (Signed)
Dr. Annamaria Boots, please advise on the results of pt's sleep study as he is calling back again requesting the results. Thanks!

## 2018-04-17 NOTE — Telephone Encounter (Signed)
Patient calling back to follow up on this message; still haven't heard back about results; pt contact # [phone]

## 2018-04-21 NOTE — Telephone Encounter (Signed)
Called and spoke with patient, he is aware of results and verbalized understanding. Patient stated that he felt fine and does not need to try the CPAP at this time. Patient is requesting for this to be printed and sent to him. Sent to patient. Nothing further needed.

## 2018-04-21 NOTE — Telephone Encounter (Signed)
Results are scanned in Epic under procedure tab; CY Please advise on what results to tell patient. Thanks.

## 2018-04-21 NOTE — Telephone Encounter (Signed)
Home sleep test showed minimal obstructive sleep apnea, averaging only 6 apneas/ hour with normal oxygen levels. We can send him a copy of his report. He may not need CPAP, but if he feels tired in the daytime, then we can try ordering CPAP auto 5-20, mask of choice, humidifier, supplies, AirView. 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

## 2018-04-21 NOTE — Telephone Encounter (Signed)
Sleep study results not available in chart at this time.  Will route to [facility], for follow up

## 2018-04-28 NOTE — Telephone Encounter (Signed)
Please give him both page of the sleep study report

## 2018-05-26 ENCOUNTER — Encounter: Payer: Self-pay | Admitting: Medical

## 2018-05-26 ENCOUNTER — Ambulatory Visit: Payer: BLUE CROSS/BLUE SHIELD | Admitting: Medical

## 2018-05-26 VITALS — BP 122/87 | HR 60 | Temp 98.1°F | Resp 16 | Ht 70.0 in | Wt 224.0 lb

## 2018-05-26 DIAGNOSIS — T7840XA Allergy, unspecified, initial encounter: Secondary | ICD-10-CM | POA: Diagnosis not present

## 2018-05-26 DIAGNOSIS — H00011 Hordeolum externum right upper eyelid: Secondary | ICD-10-CM | POA: Diagnosis not present

## 2018-05-26 MED ORDER — CEPHALEXIN 500 MG PO CAPS
500.0000 mg | ORAL_CAPSULE | Freq: Two times a day (BID) | ORAL | 0 refills | Status: DC
Start: 2018-05-26 — End: 2020-09-20

## 2018-05-26 MED ORDER — EPINEPHRINE 0.3 MG/0.3ML IJ SOAJ: INTRAMUSCULAR | 1 refills | Status: DC

## 2018-05-26 MED ORDER — METHYLPREDNISOLONE ACETATE 80 MG/ML IJ SUSP
80.0000 mg | Freq: Once | INTRAMUSCULAR | Status: AC
  Administered 2018-05-26: 80 mg via INTRAMUSCULAR

## 2018-05-26 MED ORDER — PREDNISONE 10 MG PO TABS: ORAL_TABLET | ORAL | 0 refills | Status: DC

## 2018-05-26 MED ORDER — TOBRAMYCIN 0.3 % OP SOLN: 2.0000 [drp] | Freq: Four times a day (QID) | OPHTHALMIC | 0 refills | Status: DC

## 2018-05-26 MED ORDER — HYDROXYZINE HCL 25 MG PO TABS
ORAL_TABLET | ORAL | 0 refills | Status: DC
Start: 2018-05-26 — End: 2020-09-20

## 2018-05-26 MED FILL — predniSONE 10 MG TABS: 10 | 6 days supply | Qty: 21 | Fill #0

## 2018-05-26 NOTE — Patient Instructions (Addendum)
You do appear to have probable allergic reaction to the right upper eye lid  region.  You describes severe itching quick onset.  Will give Depo-Medrol 80 mg IM injection today.  Also prescribe 6-day taper dose of prednisone.  Will prescribe hydroxyzine antihistamine to help with itching at night.  On palpation the right upper eyelid/eyelash margin feels like you might have a stye presently as well.  Will prescribe Keflex antibiotic.  If lid swelling decreases dramatically and you can open your eye  easier than can apply Tobrex eyedrops.  If you get any orbit/eyeball pain then recommend ED evaluation.  Also if you get swelling to your left eyelid, lip swelling, shortness of breath, wheezing or feel like having difficulty swallowing then recommend ED evaluation.  Also if you do not feel any incremental improvement with eyelid swelling by tomorrow then might recommend ED evaluation as well.  Please update me tomorrow morning if he has had some incremental improvement.  Refilled your epi-pen as well.  Would also asked that you follow-up on Wednesday morning or as needed.

## 2018-05-26 NOTE — Progress Notes (Signed)
Subjective:    Patient ID: Ray Lopez, male    DOB: 05-Feb-1976, 43 y.o.   MRN: [id number]  HPI  Pt in states his rt upper eye lid started to itch and he got some swelling to his upper lid area. It does not hurt. He states itches a lot.  Pt has no shortness of breath of wheezing. No tongue swelling. No difficulty swallowing.  Pt states no orbit pain. No dc.  He thinks his face is not swollen.  Pt does have history of shell fish allergies. He had history of severe reaction. Had ei-pen rx in past. He never had to use. No recent sea food or shell fish.  On review. No new detergents soaps, creams or other exposure on review. Pt does not know any particular exposure.     Review of Systems  Constitutional: Negative for chills, fatigue and fever.  Respiratory: Negative for cough, chest tightness, shortness of breath and wheezing.   Cardiovascular: Negative for chest pain and palpitations.  Gastrointestinal: Negative for abdominal pain.  Genitourinary: Negative for frequency.  Musculoskeletal: Negative for back pain.  Skin: Negative for rash.       Upper and lower  eye lid swollen  Neurological: Negative for dizziness and headaches.  Hematological: Negative for adenopathy. Does not bruise/bleed easily.  Psychiatric/Behavioral: Negative for behavioral problems and confusion.   Past Medical History:  Diagnosis Date  . Allergic rhinitis   . Asthma   . Blood pressure elevated without history of HTN   . Chicken pox   . Genital herpes   . Low testosterone      Social History   Socioeconomic History  . Marital status: Married    Spouse name: Not on file  . Number of children: Not on file  . Years of education: Not on file  . Highest education level: Not on file  Occupational History  . Not on file  Social Needs  . Financial resource strain: Not on file  . Food insecurity:    Worry: Not on file    Inability: Not on file  . Transportation needs:    Medical: Not on  file    Non-medical: Not on file  Tobacco Use  . Smoking status: Never Smoker  . Smokeless tobacco: Never Used  Substance and Sexual Activity  . Alcohol use: Yes    Alcohol/week: 6.0 standard drinks    Types: 6 Cans of beer per week  . Drug use: No  . Sexual activity: Yes    Birth control/protection: Surgical  Lifestyle  . Physical activity:    Days per week: Not on file    Minutes per session: Not on file  . Stress: Not on file  Relationships  . Social connections:    Talks on phone: Not on file    Gets together: Not on file    Attends religious service: Not on file    Active member of club or organization: Not on file    Attends meetings of clubs or organizations: Not on file    Relationship status: Not on file  . Intimate partner violence:    Fear of current or ex partner: Not on file    Emotionally abused: Not on file    Physically abused: Not on file    Forced sexual activity: Not on file  Other Topics Concern  . Not on file  Social History Narrative   Freight forwarder for [employer]   Married over 12 years   12  yr old daughter          Past Surgical History:  Procedure Laterality Date  . REFRACTIVE SURGERY  4/13   lasix eye surgery  . VASECTOMY  10/2010    Family History  Problem Relation Age of Onset  . Diabetes Maternal Grandmother   . Hypertension Mother        Living  . Hyperlipidemia Mother   . Diverticulitis Mother   . Healthy Father        Living  . Diabetes Brother   . Healthy Sister   . Allergies Daughter   . Coronary artery disease Neg Hx   . Stroke Neg Hx   . Breast cancer Neg Hx   . Colon cancer Neg Hx   . Prostate cancer Neg Hx     Allergies  Allergen Reactions  . Shellfish Allergy Anaphylaxis    Current Outpatient Medications on File Prior to Visit  Medication Sig Dispense Refill  . Multiple Vitamins-Minerals (MULTIVITAMIN PO) Take by mouth daily. [employer] MENS MVI    . valACYclovir (VALTREX) 1000 MG tablet TAKE ONE TABLET BY MOUTH ONCE  DAILY 30 tablet 0  . valACYclovir (VALTREX) 1000 MG tablet Take 1 tablet (1,000 mg total) by mouth daily. 90 tablet 3   No current facility-administered medications on file prior to visit.     BP 122/87   Pulse 60   Temp 98.1 F (36.7 C) (Oral)   Resp 16   Ht 5\' 10"  (1.778 m)   Wt 224 lb (101.6 kg)   SpO2 98%   BMI 32.14 kg/m        Objective:   Physical Exam  General- No acute distress. Pleasant patient. Neck- Full range of motion, no jvd Lungs- Clear, even and unlabored. Heart- regular rate and rhythm. Neurologic- CNII- XII grossly intact.  Eyes- upper and lower lid swollen. Upper eye lid swelling severe. Lower lid- mild-moderate. Lower portion of upper lid at eye large margin very tender and thought felt stye. Rt eye conjunctiva clear. No dc.  On inspection. The orbit rt side does not look swollen.  Skin- lateral eye area. No vesicles, no lesion. No breakdown.      Assessment & Plan:  You do appear to have probable allergic reaction to the right upper eye lid  region.  You describes severe itching quick onset.  Will give Depo-Medrol 80 mg IM injection today.  Also prescribe 6-day taper dose of prednisone.  Will prescribe hydroxyzine antihistamine to help with itching at night.  On palpation the right upper eyelid/eyelash margin feels like you might have a stye presently as well.  Will prescribe Keflex antibiotic.  If lid swelling decreases dramatically and you can open your  Eye easier than can apply Tobrex eyedrops.  If you get any orbit/eyeball pain then recommend ED evaluation.  Also if you get swelling to your left eyelid, lip swelling, shortness of breath, wheezing or feel like having difficulty swallowing then recommend ED evaluation.  Also if you do not feel any incremental improvement with eyelid swelling by tomorrow then might recommend ED evaluation as well.  Please update me tomorrow morning if he has had some incremental improvement.  Refilled your  epi-pen as well.  Would also asked that you follow-up on Wednesday morning or as needed.   Mackie Pai, PA-C

## 2018-05-28 ENCOUNTER — Ambulatory Visit: Payer: BLUE CROSS/BLUE SHIELD | Admitting: Medical

## 2018-05-28 ENCOUNTER — Encounter: Payer: Self-pay | Admitting: Medical

## 2018-05-28 VITALS — BP 138/87 | HR 64 | Temp 98.5°F | Resp 16 | Ht 70.0 in | Wt 225.4 lb

## 2018-05-28 DIAGNOSIS — R7989 Other specified abnormal findings of blood chemistry: Secondary | ICD-10-CM

## 2018-05-28 DIAGNOSIS — R5383 Other fatigue: Secondary | ICD-10-CM | POA: Diagnosis not present

## 2018-05-28 DIAGNOSIS — Z125 Encounter for screening for malignant neoplasm of prostate: Secondary | ICD-10-CM

## 2018-05-28 DIAGNOSIS — T7840XA Allergy, unspecified, initial encounter: Secondary | ICD-10-CM

## 2018-05-28 MED ORDER — EPINEPHRINE 0.3 MG/0.3ML IJ SOAJ: INTRAMUSCULAR | 1 refills | Status: DC

## 2018-05-28 NOTE — Progress Notes (Signed)
Subjective:    Patient ID: Ray Lopez, male    DOB: 1975/08/06, 43 y.o.   MRN: [id number]  HPI Pt eye swelling is completely better now. Within 24 hours the swelling went down.  He noticed he had a lot more energy and that he always feels tired by 2 pm. He has been feeling tired for months but describes some frustration with low energy after experiencing much more energy after steroid injection.  He also reports some low libido and decreased sex drive. Chart states hx of low T.    Review of Systems  Constitutional: Positive for fatigue. Negative for chills and fever.  Respiratory: Negative for cough, chest tightness, shortness of breath and wheezing.   Cardiovascular: Negative for chest pain and palpitations.  Gastrointestinal: Negative for abdominal pain.  Genitourinary:       Low libido. Low T hx.  Musculoskeletal: Negative for back pain and gait problem.  Skin: Negative for rash.  Hematological: Negative for adenopathy. Does not bruise/bleed easily.  Psychiatric/Behavioral: Negative for confusion and sleep disturbance. The patient is not nervous/anxious.     Past Medical History:  Diagnosis Date  . Allergic rhinitis   . Asthma   . Blood pressure elevated without history of HTN   . Chicken pox   . Genital herpes   . Low testosterone      Social History   Socioeconomic History  . Marital status: Married    Spouse name: Not on file  . Number of children: Not on file  . Years of education: Not on file  . Highest education level: Not on file  Occupational History  . Not on file  Social Needs  . Financial resource strain: Not on file  . Food insecurity:    Worry: Not on file    Inability: Not on file  . Transportation needs:    Medical: Not on file    Non-medical: Not on file  Tobacco Use  . Smoking status: Never Smoker  . Smokeless tobacco: Never Used  Substance and Sexual Activity  . Alcohol use: Yes    Alcohol/week: 6.0 standard drinks    Types: 6  Cans of beer per week  . Drug use: No  . Sexual activity: Yes    Birth control/protection: Surgical  Lifestyle  . Physical activity:    Days per week: Not on file    Minutes per session: Not on file  . Stress: Not on file  Relationships  . Social connections:    Talks on phone: Not on file    Gets together: Not on file    Attends religious service: Not on file    Active member of club or organization: Not on file    Attends meetings of clubs or organizations: Not on file    Relationship status: Not on file  . Intimate partner violence:    Fear of current or ex partner: Not on file    Emotionally abused: Not on file    Physically abused: Not on file    Forced sexual activity: Not on file  Other Topics Concern  . 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 REFRACTIVE SURGERY  4/13   lasix eye surgery  . VASECTOMY  10/2010    Family History  Problem Relation Age  of Onset  . Diabetes Maternal Grandmother   . Hypertension Mother        Living  . Hyperlipidemia Mother   . Diverticulitis Mother   . Healthy Father        Living  . Diabetes Brother   . Healthy Sister   . Allergies Daughter   . Coronary artery disease Neg Hx   . Stroke Neg Hx   . Breast cancer Neg Hx   . Colon cancer Neg Hx   . Prostate cancer Neg Hx     Allergies  Allergen Reactions  . Shellfish Allergy Anaphylaxis    Current Outpatient Medications on File Prior to Visit  Medication Sig Dispense Refill  . cephALEXin (KEFLEX) 500 MG capsule Take 1 capsule (500 mg total) by mouth 2 (two) times daily. 20 capsule 0  . hydrOXYzine (ATARAX/VISTARIL) 25 MG tablet 1 tablet p.o. every 8 hours as needed itching. 21 tablet 0  . Multiple Vitamins-Minerals (MULTIVITAMIN PO) Take by mouth daily. [employer] MENS MVI    . predniSONE (DELTASONE) 10 MG tablet 6 TAB PO DAY 1 5 TAB PO DAY 2 4  TAB PO DAY 3 3 TAB PO DAY 4 2 TAB PO DAY 5 1 TAB PO DAY 6 21 tablet 0  . tobramycin (TOBREX) 0.3 % ophthalmic solution Place 2 drops into the right eye every 6 (six) hours. 5 mL 0  . valACYclovir (VALTREX) 1000 MG tablet TAKE ONE TABLET BY MOUTH ONCE DAILY 30 tablet 0  . valACYclovir (VALTREX) 1000 MG tablet Take 1 tablet (1,000 mg total) by mouth daily. 90 tablet 3   No current facility-administered medications on file prior to visit.     BP 138/87   Pulse 64   Temp 98.5 F (36.9 C) (Oral)   Resp 16   Ht 5\' 10"  (1.778 m)   Wt 225 lb 6.4 oz (102.2 kg)   SpO2 100%   BMI 32.34 kg/m       Objective:   Physical Exam  General Mental Status- Alert. General Appearance- Not in acute distress.   Right eye-upper and lower lids have no swelling at all.  No warmth or tenderness.  No stye felt on palpation.  No discharge from the eye.  Conjunctival looks clear.  General- No acute distress. Pleasant patient. Neck- no thyromegaly. Lungs- Clear, even and unlabored. Heart- regular rate and rhythm. Neurologic- CNII- XII grossly intact.          Assessment & Plan:  Your got better quickly with steroid injection and first day tabs of oral prednisone.  So much so that I do not think further treatment is needed presently.  I do not think oral antibiotic for antibiotic eyedrops needed presently.  Hopefully you will have any recurrent severe allergic reactions like this again.  If you do have recurrent type reactions then might need to refer you back to your allergist to do further testing in order to identify cause.  For your history of severe/anaphylactic type reaction to shellfish, I am giving you print prescription of EpiPen.  Would recommend that you go downstairs and see if they have ability to order that from the hospital in the event that they do not have any in stock.  You noted some chronic fatigue daily basis.  Noticed more after comparing how you felt after steroid injection.   Also history of low testosterone and some low libido.  Placing future labs to include TSH, CBC, CMP, B12, B1, and vitamin D.  Also  labs will include testosterone panel and a PSA.   We referred you to endocrinologist if testosterone is low.  Follow-up to be determined after lab review.  Mackie Pai, PA-C

## 2018-05-28 NOTE — Patient Instructions (Signed)
Your got better quickly with steroid injection and first day tabs of oral prednisone.  So much so that I do not think further treatment is needed presently.  I do not think oral antibiotic for antibiotic eyedrops needed presently.  Hopefully you will have any recurrent severe allergic reactions like this again.  If you do have recurrent type reactions then might need to refer you back to your allergist to do further testing in order to identify cause.  For your history of severe/anaphylactic type reaction to shellfish, I am giving you print prescription of EpiPen.  Would recommend that you go downstairs and see if they have ability to order that from the hospital in the event that they do not have any in stock.  You noted some chronic fatigue daily basis.  Noticed more after comparing how you felt after steroid injection.  Also history of low testosterone and some low libido.  Placing future labs to include TSH, CBC, CMP, B12, B1, and vitamin D.  Also labs will include testosterone panel and a PSA.   We referred you to endocrinologist if testosterone is low.  Follow-up to be determined after lab review.

## 2018-06-02 ENCOUNTER — Other Ambulatory Visit (INDEPENDENT_AMBULATORY_CARE_PROVIDER_SITE_OTHER): Payer: BLUE CROSS/BLUE SHIELD

## 2018-06-02 DIAGNOSIS — R5383 Other fatigue: Secondary | ICD-10-CM | POA: Diagnosis not present

## 2018-06-02 DIAGNOSIS — Z125 Encounter for screening for malignant neoplasm of prostate: Secondary | ICD-10-CM

## 2018-06-02 DIAGNOSIS — R7989 Other specified abnormal findings of blood chemistry: Secondary | ICD-10-CM

## 2018-06-02 LAB — COMPREHENSIVE METABOLIC PANEL
ALT: 52 U/L (ref 0–53)
AST: 31 U/L (ref 0–37)
Albumin: 4.2 g/dL (ref 3.5–5.2)
Alkaline Phosphatase: 48 U/L (ref 39–117)
BUN: 9 mg/dL (ref 6–23)
CO2: 30 mEq/L (ref 19–32)
Calcium: 9.9 mg/dL (ref 8.4–10.5)
Chloride: 103 mEq/L (ref 96–112)
Creatinine, Ser: 1.08 mg/dL (ref 0.40–1.50)
GFR: 89.43 mL/min (ref >60.00)
Glucose, Bld: 102 mg/dL — ABNORMAL HIGH (ref 70–99)
Potassium: 4.8 mEq/L (ref 3.5–5.1)
Sodium: 139 mEq/L (ref 135–145)
Total Bilirubin: 0.5 mg/dL (ref 0.2–1.2)
Total Protein: 6.8 g/dL (ref 6.0–8.3)

## 2018-06-02 LAB — CBC WITH DIFFERENTIAL/PLATELET
BASOS PCT: 0.7 % (ref 0.0–3.0)
Basophils Absolute: 0 10*3/uL (ref 0.0–0.1)
EOS ABS: 0.1 10*3/uL (ref 0.0–0.7)
Eosinophils Relative: 2.3 % (ref 0.0–5.0)
HCT: 45.5 % (ref 39.0–52.0)
Hemoglobin: 15.6 g/dL (ref 13.0–17.0)
Lymphocytes Relative: 42.6 % (ref 12.0–46.0)
Lymphs Abs: 1.6 10*3/uL (ref 0.7–4.0)
MCHC: 34.2 g/dL (ref 30.0–36.0)
MCV: 95.5 fl (ref 78.0–100.0)
Monocytes Absolute: 0.3 10*3/uL (ref 0.1–1.0)
Monocytes Relative: 8.9 % (ref 3.0–12.0)
Neutro Abs: 1.7 10*3/uL (ref 1.4–7.7)
Neutrophils Relative %: 45.5 % (ref 43.0–77.0)
Platelets: 266 10*3/uL (ref 150.0–400.0)
RBC: 4.77 Mil/uL (ref 4.22–5.81)
RDW: 14 % (ref 11.5–15.5)
WBC: 3.8 10*3/uL — ABNORMAL LOW (ref 4.0–10.5)

## 2018-06-02 LAB — TSH: TSH: 2.61 u[IU]/mL (ref 0.35–4.50)

## 2018-06-02 LAB — VITAMIN B12: Vitamin B-12: 341 pg/mL (ref 211–911)

## 2018-06-02 LAB — PSA: PSA: 0.77 ng/mL (ref 0.10–4.00)

## 2018-06-06 LAB — TESTOSTERONE TOTAL,FREE,BIO, MALES
Albumin: 4.2 g/dL (ref 3.6–5.1)
Sex Hormone Binding: 17 nmol/L (ref 10–50)
Testosterone, Bioavailable: 183.5 ng/dL (ref >=110.0)
Testosterone, Free: 95.3 pg/mL (ref 46.0–224.0)
Testosterone: 427 ng/dL (ref 250–827)

## 2018-06-06 LAB — VITAMIN D 1,25 DIHYDROXY
VITAMIN D3 1, 25 (OH): 37 pg/mL
Vitamin D 1, 25 (OH)2 Total: 37 pg/mL (ref 18–72)
Vitamin D2 1, 25 (OH)2: <8 pg/mL

## 2018-06-06 LAB — VITAMIN B1: Vitamin B1 (Thiamine): 14 nmol/L (ref 8–30)

## 2018-06-27 ENCOUNTER — Ambulatory Visit: Payer: Managed Care, Other (non HMO) | Admitting: Internal Medicine

## 2018-11-01 ENCOUNTER — Encounter: Payer: Self-pay | Admitting: Medical

## 2018-11-04 ENCOUNTER — Ambulatory Visit (INDEPENDENT_AMBULATORY_CARE_PROVIDER_SITE_OTHER): Payer: BC Managed Care – PPO | Admitting: Medical

## 2018-11-04 ENCOUNTER — Telehealth: Payer: Self-pay | Admitting: Medical

## 2018-11-04 ENCOUNTER — Encounter: Payer: Self-pay | Admitting: Medical

## 2018-11-04 VITALS — Ht 69.0 in | Wt 225.0 lb

## 2018-11-04 DIAGNOSIS — Z20822 Contact with and (suspected) exposure to covid-19: Secondary | ICD-10-CM

## 2018-11-04 DIAGNOSIS — Z0000 Encounter for general adult medical examination without abnormal findings: Secondary | ICD-10-CM

## 2018-11-04 DIAGNOSIS — Z20828 Contact with and (suspected) exposure to other viral communicable diseases: Secondary | ICD-10-CM | POA: Diagnosis not present

## 2018-11-04 NOTE — Telephone Encounter (Signed)
Pt has been in [place] and he was working in facility with a lot of persons. Wearing mask but impossible to social distance. He is back in town on Friday. Can you get him scheduled for covid testing. Dx would be covid exposure(use that as code).   Pt wants test since numbers in [place] recently increase a lot. He was there for 2 weeks.

## 2018-11-04 NOTE — Progress Notes (Signed)
Subjective:    Patient ID: Ray Lopez, male    DOB: 01-20-1976, 43 y.o.   MRN: [id number]  HPI Virtual Visit via Video Note  I connected with Ray Lopez on 11/04/18 at  4:00 PM EDT by a video enabled telemedicine application and verified that I am speaking with the correct person using two identifiers.  Location: Patient: home Provider: office  Pt does not have bp monitor with him. Asked him to go to pharmacy or check bp or get machine. Check and update me by my chart.   I discussed the limitations of evaluation and management by telemedicine and the availability of in person appointments. The patient expressed understanding and agreed to proceed.  History of Present Illness:   Pt visit today for wellness exam and to talk about covid for potential testing.  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 He states job is stressful job(no job change). Ray Lopez a lot less after covid but recent increase travel since mid june. Weekends are at home. Pt still working out 3 times a week. Eating Healthier on the road(not eating past 7 pm). He is drinking less beer now very rarely. Decrease from last visit.   Married- one daughter.(vasectomy in 2013)  Pt ok with give tdap.  No family history of prostate cancer  Pt is in [place] and he is concerned about exposure to covid. He is inside distribution center. He states it is impossible to social distance. He is wearing a mask. June 22 nd positive covid test for employee. Pt did not have direct contact with person who tested positive.   Observations/Objective:  General-no acute distress, pleasant, oriented. Lungs- on inspection lungs appear unlabored. Neck- no tracheal deviation or jvd on inspection. Neuro- gross motor function appears intact.   Assessment and Plan: For you wellness exam today I have ordered cbc, cmp, and lipid panel  Vaccine up to date.  Reminded patient today to get flu vaccine in early fall.   Recommend exercise and healthy diet.  We will let you know lab results as they come in.  Follow up date appointment will be determined after lab review.   Regarding patient's recent trip to [place] and potential exposure to COVID, I did place order for him to get COVID testing.  Requested that he be tested on Friday afternoon per his request.  His circumstances are such that he did not have known direct contact with persons but [place] has recent increase in number of infections.  Also one person and very large facility had COVID where he was working.  However he does not have any known direct specific contact with that person.  So I advised patient to self quarantine over the weekend to watch for any new signs or symptoms.  Explained to patient that if such were to occur then let me know and in that event I would give him the standard 10-day restrictions.  If by chance he comes back positive then would write work note for him and explained return to work guidelines and need for retesting as well.  Ray Pai, PA-C  Follow Up Instructions:    I discussed the assessment and treatment plan with the patient. The patient was provided an opportunity to ask questions and all were answered. The patient agreed with the plan and demonstrated an understanding of the instructions.   The patient was advised to call back or seek an in-person evaluation if the symptoms worsen or if the condition fails to improve as anticipated.  I provided 20 minutes of non-face-to-face time during this encounter.   Ray Pai, PA-C    Review of Systems  Constitutional: Negative for chills, fatigue and fever.  HENT: Negative for congestion.   Respiratory: Negative for cough, chest tightness, shortness of breath and wheezing.   Cardiovascular: Negative for chest pain and palpitations.  Gastrointestinal: Negative for abdominal pain.  Endocrine: Negative for polydipsia and polyphagia.  Genitourinary:  Negative for dysuria, enuresis and flank pain.  Musculoskeletal: Negative for back pain.  Skin: Negative for rash.  Neurological: Negative for dizziness, syncope, weakness, light-headedness, numbness and headaches.  Hematological: Negative for adenopathy. Does not bruise/bleed easily.  Psychiatric/Behavioral: Negative for behavioral problems, decreased concentration and suicidal ideas. The patient is not nervous/anxious.        Objective:   Physical Exam        Assessment & Plan:

## 2018-11-04 NOTE — Patient Instructions (Addendum)
For you wellness exam today I have ordered cbc, cmp, and lipid panel  Vaccine up to date.  Reminded patient today to get flu vaccine in early fall.  Recommend exercise and healthy diet.  We will let you know lab results as they come in.  Follow up date appointment will be determined after lab review.   Regarding patient's recent trip to [place] and potential exposure to COVID, I did place order for him to get COVID testing.  Requested that he be tested on Friday afternoon per his request.  His circumstances are such that he did not have known direct contact with persons but [place] has recent increase in number of infections.  Also one person and very large facility had COVID where he was working.  However he does not have any known direct specific contact with that person.  So I advised patient to self quarantine over the weekend to watch for any new signs or symptoms.  Explained to patient that if such were to occur then let me know and in that event I would give him the standard 10-day restrictions.  If by chance he comes back positive then would write work note for him and explained return to work guidelines and need for retesting as well.   Preventive Care 29-56 Years Old, Male Preventive care refers to lifestyle choices and visits with your health care provider that can promote health and wellness. This includes:  A yearly physical exam. This is also called an annual well check.  Regular dental and eye exams.  Immunizations.  Screening for certain conditions.  Healthy lifestyle choices, such as eating a healthy diet, getting regular exercise, not using drugs or products that contain nicotine and tobacco, and limiting alcohol use. What can I expect for my preventive care visit? Physical exam Your health care provider will check:  Height and weight. These may be used to calculate body mass index (BMI), which is a measurement that tells if you are at a healthy weight.  Heart  rate and blood pressure.  Your skin for abnormal spots. Counseling Your health care provider may ask you questions about:  Alcohol, tobacco, and drug use.  Emotional well-being.  Home and relationship well-being.  Sexual activity.  Eating habits.  Work and work Statistician. What immunizations do I need?  Influenza (flu) vaccine  This is recommended every year. Tetanus, diphtheria, and pertussis (Tdap) vaccine  You may need a Td booster every 10 years. Varicella (chickenpox) vaccine  You may need this vaccine if you have not already been vaccinated. Zoster (shingles) vaccine  You may need this after age 42. Measles, mumps, and rubella (MMR) vaccine  You may need at least one dose of MMR if you were born in 1957 or later. You may also need a second dose. Pneumococcal conjugate (PCV13) vaccine  You may need this if you have certain conditions and were not previously vaccinated. Pneumococcal polysaccharide (PPSV23) vaccine  You may need one or two doses if you smoke cigarettes or if you have certain conditions. Meningococcal conjugate (MenACWY) vaccine  You may need this if you have certain conditions. Hepatitis A vaccine  You may need this if you have certain conditions or if you travel or work in places where you may be exposed to hepatitis A. Hepatitis B vaccine  You may need this if you have certain conditions or if you travel or work in places where you may be exposed to hepatitis B. Haemophilus influenzae type b (Hib) vaccine  You may need this if you have certain risk factors. Human papillomavirus (HPV) vaccine  If recommended by your health care provider, you may need three doses over 6 months. You may receive vaccines as individual doses or as more than one vaccine together in one shot (combination vaccines). Talk with your health care provider about the risks and benefits of combination vaccines. What tests do I need? Blood tests  Lipid and cholesterol  levels. These may be checked every 5 years, or more frequently if you are over 49 years old.  Hepatitis C test.  Hepatitis B test. Screening  Lung cancer screening. You may have this screening every year starting at age 42 if you have a 30-pack-year history of smoking and currently smoke or have quit within the past 15 years.  Prostate cancer screening. Recommendations will vary depending on your family history and other risks.  Colorectal cancer screening. All adults should have this screening starting at age 54 and continuing until age 44. Your health care provider may recommend screening at age 79 if you are at increased risk. You will have tests every 1-10 years, depending on your results and the type of screening test.  Diabetes screening. This is done by checking your blood sugar (glucose) after you have not eaten for a while (fasting). You may have this done every 1-3 years.  Sexually transmitted disease (STD) testing. Follow these instructions at home: Eating and drinking  Eat a diet that includes fresh fruits and vegetables, whole grains, lean protein, and low-fat dairy products.  Take vitamin and mineral supplements as recommended by your health care provider.  Do not drink alcohol if your health care provider tells you not to drink.  If you drink alcohol: ? Limit how much you have to 0-2 drinks a day. ? Be aware of how much alcohol is in your drink. In the [place] one drink equals one 12 oz bottle of beer (355 mL), one 5 oz glass of wine (148 mL), or one 1 oz glass of hard liquor (44 mL). Lifestyle  Take daily care of your teeth and gums.  Stay active. Exercise for at least 30 minutes on 5 or more days each week.  Do not use any products that contain nicotine or tobacco, such as cigarettes, e-cigarettes, and chewing tobacco. If you need help quitting, ask your health care provider.  If you are sexually active, practice safe sex. Use a condom or other form of protection  to prevent STIs (sexually transmitted infections).  Talk with your health care provider about taking a low-dose aspirin every day starting at age 64. What's next?  Go to your health care provider once a year for a well check visit.  Ask your health care provider how often you should have your eyes and teeth checked.  Stay up to date on all vaccines. This information is not intended to replace advice given to you by your health care provider. Make sure you discuss any questions you have with your health care provider. Document Released: 05/13/2015 Document Revised: 04/10/2018 Document Reviewed: 04/10/2018 Elsevier Patient Education  2020 [employer].

## 2018-11-06 ENCOUNTER — Telehealth: Payer: Self-pay | Admitting: Medical

## 2018-11-06 ENCOUNTER — Telehealth: Payer: Self-pay | Admitting: *Deleted

## 2018-11-06 ENCOUNTER — Ambulatory Visit: Payer: Self-pay

## 2018-11-06 DIAGNOSIS — Z20822 Contact with and (suspected) exposure to covid-19: Secondary | ICD-10-CM

## 2018-11-06 NOTE — Telephone Encounter (Signed)
Pt has been in [place] and he was working in facility with a lot of persons. Wearing mask but impossible to social distance. He is back in town on Friday. Can you get him scheduled for covid testing. Dx would be covid exposure(use that as code).   Pt wants test since numbers in [place] recently increase a lot. He was there for 2 weeks.

## 2018-11-06 NOTE — Telephone Encounter (Signed)
Noted, thank you

## 2018-11-06 NOTE — Telephone Encounter (Signed)
Pt called to schedule the covid testing. There is no order put in pts chart. Please advise.

## 2018-11-06 NOTE — Telephone Encounter (Signed)
Patient had virtual visit with me the other day.  I sent message over to the [facility] to request patient get scheduled for testing.  They usually send the order over and I signed it.  Not sure what is happening presently with the [facility] some of my patients are not getting called quickly.  I am assuming there must be a massive number of people needing testing?.  I sent another request today.  Please notify patient of the above.

## 2018-11-06 NOTE — Telephone Encounter (Signed)
Please advise 

## 2018-11-06 NOTE — Telephone Encounter (Signed)
Pt notified to be scheduled for covid-19 testing. He is scheduled for tomorrow at [facility] at 8:30. He is advised that this is a drive thru test site, so to stay in car with mask on, and keep windows rolled up until time for testing. He voiced understanding.

## 2018-11-06 NOTE — Telephone Encounter (Signed)
Pt has been scheduled for testing on 11/07/18. The [facility] is receiving a massive amount of request for testing from multiple offices. Currently we are attempting to reach the pt between 1-2 days of request being sent.

## 2018-11-07 ENCOUNTER — Other Ambulatory Visit: Payer: BC Managed Care – PPO

## 2018-11-07 DIAGNOSIS — Z20822 Contact with and (suspected) exposure to covid-19: Secondary | ICD-10-CM

## 2018-11-07 DIAGNOSIS — R6889 Other general symptoms and signs: Secondary | ICD-10-CM | POA: Diagnosis not present

## 2018-11-13 LAB — NOVEL CORONAVIRUS, NAA: SARS-CoV-2, NAA: NOT DETECTED

## 2019-07-30 ENCOUNTER — Ambulatory Visit: Payer: BC Managed Care – PPO | Attending: Internal Medicine

## 2019-07-30 DIAGNOSIS — Z23 Encounter for immunization: Secondary | ICD-10-CM

## 2019-07-30 NOTE — Progress Notes (Signed)
   Covid-19 Vaccination Clinic  Name:  Ray Lopez    MRN: [id number] DOB: [birth year]  07/30/2019  Ray Lopez was observed post Covid-19 immunization for 15 minutes without incident. He was provided with Vaccine Information Sheet and instruction to access the V-Safe system.   Ray Lopez was instructed to call 911 with any severe reactions post vaccine: Marland Kitchen Difficulty breathing  . Swelling of face and throat  . A fast heartbeat  . A bad rash all over body  . Dizziness and weakness   Immunizations Administered    Name Date Dose VIS Date Route   Pfizer COVID-19 Vaccine 07/30/2019  8:45 AM 0.3 mL 04/10/2019 Intramuscular   Manufacturer: [place]   Lot: [id number]   [place]: [id number]

## 2019-08-24 ENCOUNTER — Ambulatory Visit: Payer: BC Managed Care – PPO | Attending: Internal Medicine

## 2019-08-24 DIAGNOSIS — Z23 Encounter for immunization: Secondary | ICD-10-CM

## 2019-08-24 NOTE — Progress Notes (Signed)
   Covid-19 Vaccination Clinic  Name:  Ray Lopez    MRN: [id number] DOB: [birth year]  08/24/2019  [name] was observed post Covid-19 immunization for 30 minutes based on pre-vaccination screening without incident. He was provided with Vaccine Information Sheet and instruction to access the V-Safe system.   [name] was instructed to call 911 with any severe reactions post vaccine: Marland Kitchen Difficulty breathing  . Swelling of face and throat  . A fast heartbeat  . A bad rash all over body  . Dizziness and weakness   Immunizations Administered    Name Date Dose VIS Date Route   Pfizer COVID-19 Vaccine 08/24/2019  8:47 AM 0.3 mL 06/24/2018 Intramuscular   Manufacturer: [place]   Lot: [id number]   [place]: [id number]

## 2019-09-07 ENCOUNTER — Ambulatory Visit (INDEPENDENT_AMBULATORY_CARE_PROVIDER_SITE_OTHER): Payer: BC Managed Care – PPO | Admitting: Medical

## 2019-09-07 ENCOUNTER — Other Ambulatory Visit: Payer: Self-pay

## 2019-09-07 VITALS — BP 128/78 | HR 66 | Temp 97.4°F | Resp 18 | Ht 69.0 in | Wt 219.6 lb

## 2019-09-07 DIAGNOSIS — Z125 Encounter for screening for malignant neoplasm of prostate: Secondary | ICD-10-CM | POA: Diagnosis not present

## 2019-09-07 DIAGNOSIS — Z113 Encounter for screening for infections with a predominantly sexual mode of transmission: Secondary | ICD-10-CM | POA: Diagnosis not present

## 2019-09-07 DIAGNOSIS — R7989 Other specified abnormal findings of blood chemistry: Secondary | ICD-10-CM | POA: Diagnosis not present

## 2019-09-07 DIAGNOSIS — Z0000 Encounter for general adult medical examination without abnormal findings: Secondary | ICD-10-CM | POA: Diagnosis not present

## 2019-09-07 LAB — COMPREHENSIVE METABOLIC PANEL
ALT: 27 U/L (ref 0–53)
AST: 22 U/L (ref 0–37)
Albumin: 4.5 g/dL (ref 3.5–5.2)
Alkaline Phosphatase: 37 U/L — ABNORMAL LOW (ref 39–117)
BUN: 10 mg/dL (ref 6–23)
CO2: 30 mEq/L (ref 19–32)
Calcium: 9.7 mg/dL (ref 8.4–10.5)
Chloride: 102 mEq/L (ref 96–112)
Creatinine, Ser: 1.12 mg/dL (ref 0.40–1.50)
GFR: 85.27 mL/min (ref >60.00)
Glucose, Bld: 109 mg/dL — ABNORMAL HIGH (ref 70–99)
Potassium: 4.5 mEq/L (ref 3.5–5.1)
Sodium: 138 mEq/L (ref 135–145)
Total Bilirubin: 0.6 mg/dL (ref 0.2–1.2)
Total Protein: 7.3 g/dL (ref 6.0–8.3)

## 2019-09-07 LAB — CBC WITH DIFFERENTIAL/PLATELET
Basophils Absolute: 0 10*3/uL (ref 0.0–0.1)
Basophils Relative: 1 % (ref 0.0–3.0)
Eosinophils Absolute: 0.2 10*3/uL (ref 0.0–0.7)
Eosinophils Relative: 6.5 % — ABNORMAL HIGH (ref 0.0–5.0)
HCT: 45.7 % (ref 39.0–52.0)
Hemoglobin: 15.5 g/dL (ref 13.0–17.0)
Lymphocytes Relative: 46.2 % — ABNORMAL HIGH (ref 12.0–46.0)
Lymphs Abs: 1.3 10*3/uL (ref 0.7–4.0)
MCHC: 34 g/dL (ref 30.0–36.0)
MCV: 96 fl (ref 78.0–100.0)
Monocytes Absolute: 0.3 10*3/uL (ref 0.1–1.0)
Monocytes Relative: 12 % (ref 3.0–12.0)
Neutro Abs: 1 10*3/uL — ABNORMAL LOW (ref 1.4–7.7)
Neutrophils Relative %: 34.3 % — ABNORMAL LOW (ref 43.0–77.0)
Platelets: 240 10*3/uL (ref 150.0–400.0)
RBC: 4.76 Mil/uL (ref 4.22–5.81)
RDW: 13.9 % (ref 11.5–15.5)
WBC: 2.9 10*3/uL — ABNORMAL LOW (ref 4.0–10.5)

## 2019-09-07 LAB — LIPID PANEL
Cholesterol: 231 mg/dL — ABNORMAL HIGH (ref 0–200)
HDL: 53.4 mg/dL (ref >39.00)
LDL Cholesterol: 149 mg/dL — ABNORMAL HIGH (ref 0–99)
NonHDL: 177.48
Total CHOL/HDL Ratio: 4
Triglycerides: 143 mg/dL (ref 0.0–149.0)
VLDL: 28.6 mg/dL (ref 0.0–40.0)

## 2019-09-07 LAB — PSA: PSA: 0.69 ng/mL (ref 0.10–4.00)

## 2019-09-07 MED ORDER — VALACYCLOVIR HCL 1 G PO TABS: 1000.0000 mg | ORAL_TABLET | Freq: Every day | ORAL | 3 refills | Status: DC

## 2019-09-07 NOTE — Progress Notes (Signed)
Subjective:    Patient ID: Ray Lopez, male    DOB: [birth year], 44 y.o.   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 He states job is stressful job(no job change). Luz Lex a lot again after spring of last year. Weekends are at home. Pt still working out 3 times a week[place](not eating past 7 pm). He is drinking less beer now very rarely(whisky on Friday and Saturday night but not a lot per pt.  Married- one daughter.(vasectomy in 2013)  Pt got tdap last year. Pt did get phizer covid vaccine. 2 injections.  No family history of prostate cancer.  Pt not sure if wants to get colonoscopy. No fh of colon cancer. Not sure if mom has polyp.     Review of Systems  Constitutional: Negative for chills, fatigue and fever.  HENT: Negative for congestion, nosebleeds, rhinorrhea, sneezing, tinnitus and voice change.   Respiratory: Negative for chest tightness, shortness of breath and wheezing.   Cardiovascular: Negative for chest pain and palpitations.  Gastrointestinal: Negative for abdominal pain, blood in stool, diarrhea and nausea.  Genitourinary: Negative for dysuria and frequency.  Musculoskeletal: Negative for back pain.  Skin: Negative for rash.  Neurological: Negative for dizziness, speech difficulty, weakness, numbness and headaches.  Hematological: Negative for adenopathy. Does not bruise/bleed easily.  Psychiatric/Behavioral: Negative for behavioral problems and decreased concentration.    Past Medical History:  Diagnosis Date  . Allergic rhinitis   . Asthma   . Blood pressure elevated without history of HTN   . Chicken pox   . Genital herpes   . Low testosterone      Social History   Socioeconomic History  . Marital status: Married    Spouse name: Not on file  . Number of children: Not on file  . Years of education: Not on file  . Highest education level: Not on file  Occupational History  . Not on file  Tobacco  Use  . Smoking status: Never Smoker  . Smokeless tobacco: Never Used  Substance and Sexual Activity  . Alcohol use: Yes    Alcohol/week: 6.0 standard drinks    Types: 6 Cans of beer per week  . Drug use: No  . Sexual activity: Yes    Birth control/protection: Surgical  Other Topics Concern  . Not on file  Social History Narrative   Freight forwarder for [employer]   Married over 21 years   81 yr old daughter         Social Determinants of Health   Financial Resource Strain:   . Difficulty of Paying Living Expenses:   Food Insecurity:   . Worried About Charity fundraiser in the Last Year:   . Arboriculturist in the Last Year:   Transportation Needs:   . Film/video editor (Medical):   Marland Kitchen Lack of Transportation (Non-Medical):   Physical Activity:   . Days of Exercise per Week:   . Minutes of Exercise per Session:   Stress:   . Feeling of Stress :   Social Connections:   . Frequency of Communication with Friends and Family:   . Frequency of Social Gatherings with Friends and Family:   . Attends Religious Services:   . Active Member of Clubs or Organizations:   . Attends Archivist Meetings:   Marland Kitchen Marital Status:   Intimate Partner Violence:   . Fear of Current or Ex-Partner:   . Emotionally Abused:   .  Physically Abused:   . Sexually Abused:     Past Surgical History:  Procedure Laterality Date  . REFRACTIVE SURGERY  4/13   lasix eye surgery  . VASECTOMY  10/2010    Family History  Problem Relation Age of Onset  . Diabetes Maternal Grandmother   . Hypertension Mother        Living  . Hyperlipidemia Mother   . Diverticulitis Mother   . Healthy Father        Living  . Diabetes Brother   . Healthy Sister   . Allergies Daughter   . Coronary artery disease Neg Hx   . Stroke Neg Hx   . Breast cancer Neg Hx   . Colon cancer Neg Hx   . Prostate cancer Neg Hx     Allergies  Allergen Reactions  . Shellfish Allergy Anaphylaxis    Current Outpatient  Medications on File Prior to Visit  Medication Sig Dispense Refill  . cephALEXin (KEFLEX) 500 MG capsule Take 1 capsule (500 mg total) by mouth 2 (two) times daily. (Patient not taking: Reported on 09/07/2019) 20 capsule 0  . EPINEPHrine 0.3 mg/0.3 mL IJ SOAJ injection Inject one dose into lateral thigh anaphylactic reaction.(one for house and one for office/travel) (Patient not taking: Reported on 09/07/2019) 2 Device 1  . hydrOXYzine (ATARAX/VISTARIL) 25 MG tablet 1 tablet p.o. every 8 hours as needed itching. (Patient not taking: Reported on 09/07/2019) 21 tablet 0  . Multiple Vitamins-Minerals (MULTIVITAMIN PO) Take by mouth daily. [employer] MENS MVI    . predniSONE (DELTASONE) 10 MG tablet 6 TAB PO DAY 1 5 TAB PO DAY 2 4 TAB PO DAY 3 3 TAB PO DAY 4 2 TAB PO DAY 5 1 TAB PO DAY 6 (Patient not taking: Reported on 09/07/2019) 21 tablet 0  . tobramycin (TOBREX) 0.3 % ophthalmic solution Place 2 drops into the right eye every 6 (six) hours. (Patient not taking: Reported on 09/07/2019) 5 mL 0  . valACYclovir (VALTREX) 1000 MG tablet TAKE ONE TABLET BY MOUTH ONCE DAILY (Patient not taking: Reported on 09/07/2019) 30 tablet 0  . valACYclovir (VALTREX) 1000 MG tablet Take 1 tablet (1,000 mg total) by mouth daily. (Patient not taking: Reported on 09/07/2019) 90 tablet 3   No current facility-administered medications on file prior to visit.    BP 128/78 (BP Location: Left Arm, Patient Position: Sitting, Cuff Size: Large)   Pulse 66   Temp (!) 97.4 F (36.3 C) (Temporal)   Resp 18   Ht 5\' 9"  (1.753 m)   Wt 219 lb 9.6 oz (99.6 kg)   SpO2 100%   BMI 32.43 kg/m       Objective:   Physical Exam  General Mental Status- Alert. General Appearance- Not in acute distress.   Skin General: Color- Normal Color. Moisture- Normal Moisture.  Neck Carotid Arteries- Normal color. Moisture- Normal Moisture. No carotid bruits. No JVD.  Chest and Lung Exam Auscultation: Breath Sounds:-Normal.  Cardiovascular  Auscultation:Rythm- Regular. Murmurs & Other Heart Sounds:Auscultation of the heart reveals- No Murmurs.  Abdomen Inspection:-Inspeection Normal. Palpation/Percussion:Note:No mass. Palpation and Percussion of the abdomen reveal- Non Tender, Non Distended + BS, no rebound or guarding.    Neurologic Cranial Nerve exam:- CN III-XII intact(No nystagmus), symmetric smile. Strength:- 5/5 equal and symmetric strength both upper and lower extremities.   Genital- no hernias on exam inguinal canals. Rectal- smooth prostates, no nodule normal size.. No blood on hemocult card.     Assessment & Plan:  For you wellness exam today I have ordered cbc, cmp and lipid panel.  Vaccines up to date.  Recommend exercise and healthy diet.  We will let you know lab results as they come in.  Follow up date appointment will be determined after lab review.   Please consider whether you want me to refer to GI MD. Also investigate if family history of polyps as cologuard is option.    Mackie Pai, PA-C

## 2019-09-07 NOTE — Patient Instructions (Addendum)
For you wellness exam today I have ordered cbc, cmp and lipid panel.   Vaccine up to date.  Recommend exercise and healthy diet.  We will let you know lab results as they come in.  Follow up date appointment will be determined after lab review.   Please consider whether you want me to refer to GI MD. Also investigate if family history of polyps as cologuard is option.     Preventive Care 46-46 Years Old, Male Preventive care refers to lifestyle choices and visits with your health care provider that can promote health and wellness. This includes:  A yearly physical exam. This is also called an annual well check.  Regular dental and eye exams.  Immunizations.  Screening for certain conditions.  Healthy lifestyle choices, such as eating a healthy diet, getting regular exercise, not using drugs or products that contain nicotine and tobacco, and limiting alcohol use. What can I expect for my preventive care visit? Physical exam Your health care provider will check:  Height and weight. These may be used to calculate body mass index (BMI), which is a measurement that tells if you are at a healthy weight.  Heart rate and blood pressure.  Your skin for abnormal spots. Counseling Your health care provider may ask you questions about:  Alcohol, tobacco, and drug use.  Emotional well-being.  Home and relationship well-being.  Sexual activity.  Eating habits.  Work and work Statistician. What immunizations do I need?  Influenza (flu) vaccine  This is recommended every year. Tetanus, diphtheria, and pertussis (Tdap) vaccine  You may need a Td booster every 10 years. Varicella (chickenpox) vaccine  You may need this vaccine if you have not already been vaccinated. Zoster (shingles) vaccine  You may need this after age 46. Measles, mumps, and rubella (MMR) vaccine  You may need at least one dose of MMR if you were born in 1957 or later. You may also need a second  dose. Pneumococcal conjugate (PCV13) vaccine  You may need this if you have certain conditions and were not previously vaccinated. Pneumococcal polysaccharide (PPSV23) vaccine  You may need one or two doses if you smoke cigarettes or if you have certain conditions. Meningococcal conjugate (MenACWY) vaccine  You may need this if you have certain conditions. Hepatitis A vaccine  You may need this if you have certain conditions or if you travel or work in places where you may be exposed to hepatitis A. Hepatitis B vaccine  You may need this if you have certain conditions or if you travel or work in places where you may be exposed to hepatitis B. Haemophilus influenzae type b (Hib) vaccine  You may need this if you have certain risk factors. Human papillomavirus (HPV) vaccine  If recommended by your health care provider, you may need three doses over 6 months. You may receive vaccines as individual doses or as more than one vaccine together in one shot (combination vaccines). Talk with your health care provider about the risks and benefits of combination vaccines. What tests do I need? Blood tests  Lipid and cholesterol levels. These may be checked every 5 years, or more frequently if you are over 46 years old.  Hepatitis C test.  Hepatitis B test. Screening  Lung cancer screening. You may have this screening every year starting at age 46 if you have a 30-pack-year history of smoking and currently smoke or have quit within the past 15 years.  Prostate cancer screening. Recommendations will vary depending  on your family history and other risks.  Colorectal cancer screening. All adults should have this screening starting at age 46 and continuing until age 11. Your health care provider may recommend screening at age 46 if you are at increased risk. You will have tests every 1-10 years, depending on your results and the type of screening test.  Diabetes screening. This is done by  checking your blood sugar (glucose) after you have not eaten for a while (fasting). You may have this done every 1-3 years.  Sexually transmitted disease (STD) testing. Follow these instructions at home: Eating and drinking  Eat a diet that includes fresh fruits and vegetables, whole grains, lean protein, and low-fat dairy products.  Take vitamin and mineral supplements as recommended by your health care provider.  Do not drink alcohol if your health care provider tells you not to drink.  If you drink alcohol: ? Limit how much you have to 0-2 drinks a day. ? Be aware of how much alcohol is in your drink. In the [place] one drink equals one 12 oz bottle of beer (355 mL), one 5 oz glass of wine (148 mL), or one 1 oz glass of hard liquor (44 mL). Lifestyle  Take daily care of your teeth and gums.  Stay active. Exercise for at least 30 minutes on 5 or more days each week.  Do not use any products that contain nicotine or tobacco, such as cigarettes, e-cigarettes, and chewing tobacco. If you need help quitting, ask your health care provider.  If you are sexually active, practice safe sex. Use a condom or other form of protection to prevent STIs (sexually transmitted infections).  Talk with your health care provider about taking a low-dose aspirin every day starting at age 4. What's next?  Go to your health care provider once a year for a well check visit.  Ask your health care provider how often you should have your eyes and teeth checked.  Stay up to date on all vaccines. This information is not intended to replace advice given to you by your health care provider. Make sure you discuss any questions you have with your health care provider. Document Revised: 04/10/2018 Document Reviewed: 04/10/2018 Elsevier Patient Education  2020 [employer].

## 2019-09-08 ENCOUNTER — Other Ambulatory Visit (INDEPENDENT_AMBULATORY_CARE_PROVIDER_SITE_OTHER): Payer: BC Managed Care – PPO

## 2019-09-08 DIAGNOSIS — R739 Hyperglycemia, unspecified: Secondary | ICD-10-CM

## 2019-09-08 LAB — HEMOGLOBIN A1C: Hgb A1c MFr Bld: 5.6 % (ref 4.6–6.5)

## 2019-09-08 LAB — HIV ANTIBODY (ROUTINE TESTING W REFLEX): HIV 1&2 Ab, 4th Generation: NONREACTIVE

## 2020-05-31 ENCOUNTER — Ambulatory Visit: Payer: BC Managed Care – PPO | Attending: Internal Medicine

## 2020-05-31 ENCOUNTER — Other Ambulatory Visit (HOSPITAL_BASED_OUTPATIENT_CLINIC_OR_DEPARTMENT_OTHER): Payer: Self-pay | Admitting: Internal Medicine

## 2020-05-31 DIAGNOSIS — Z23 Encounter for immunization: Secondary | ICD-10-CM

## 2020-05-31 MED FILL — PFIZER-BIONTECH COVID-19 VA: 30 | 21 days supply | Qty: 0 | Fill #0

## 2020-05-31 NOTE — Progress Notes (Signed)
   Covid-19 Vaccination Clinic  Name:  Ray Lopez    MRN: [id number] DOB: [birth year]  05/31/2020  [name] was observed post Covid-19 immunization for 15 minutes without incident. He was provided with Vaccine Information Sheet and instruction to access the V-Safe system. Vaccinated by [employer].  [name] was instructed to call 911 with any severe reactions post vaccine: Marland Kitchen Difficulty breathing  . Swelling of face and throat  . A fast heartbeat  . A bad rash all over body  . Dizziness and weakness   Immunizations Administered    Name Date Dose VIS Date Route   PFIZER Comrnaty([place] TOP) Covid-19 Vaccine 05/31/2020 12:08 PM 0.3 mL 04/07/2020 Intramuscular   Manufacturer: [place]   Lot: [id number]   NDC: [phone]

## 2020-09-12 ENCOUNTER — Encounter: Payer: BC Managed Care – PPO | Admitting: Medical

## 2020-09-19 ENCOUNTER — Other Ambulatory Visit: Payer: Self-pay

## 2020-09-20 ENCOUNTER — Ambulatory Visit (INDEPENDENT_AMBULATORY_CARE_PROVIDER_SITE_OTHER): Payer: BC Managed Care – PPO | Admitting: Medical

## 2020-09-20 ENCOUNTER — Encounter: Payer: Self-pay | Admitting: Medical

## 2020-09-20 VITALS — BP 123/79 | HR 62 | Temp 98.1°F | Resp 18 | Ht 69.0 in | Wt 220.4 lb

## 2020-09-20 DIAGNOSIS — Z0000 Encounter for general adult medical examination without abnormal findings: Secondary | ICD-10-CM

## 2020-09-20 DIAGNOSIS — Z125 Encounter for screening for malignant neoplasm of prostate: Secondary | ICD-10-CM | POA: Diagnosis not present

## 2020-09-20 DIAGNOSIS — Z1211 Encounter for screening for malignant neoplasm of colon: Secondary | ICD-10-CM | POA: Diagnosis not present

## 2020-09-20 DIAGNOSIS — Z113 Encounter for screening for infections with a predominantly sexual mode of transmission: Secondary | ICD-10-CM

## 2020-09-20 DIAGNOSIS — K429 Umbilical hernia without obstruction or gangrene: Secondary | ICD-10-CM | POA: Diagnosis not present

## 2020-09-20 LAB — CBC WITH DIFFERENTIAL/PLATELET
Basophils Absolute: 0 10*3/uL (ref 0.0–0.1)
Basophils Relative: 0.9 % (ref 0.0–3.0)
Eosinophils Absolute: 0.1 10*3/uL (ref 0.0–0.7)
Eosinophils Relative: 3.6 % (ref 0.0–5.0)
HCT: 44.4 % (ref 39.0–52.0)
Hemoglobin: 15.1 g/dL (ref 13.0–17.0)
Lymphocytes Relative: 39.1 % (ref 12.0–46.0)
Lymphs Abs: 1.2 10*3/uL (ref 0.7–4.0)
MCHC: 34 g/dL (ref 30.0–36.0)
MCV: 97 fl (ref 78.0–100.0)
Monocytes Absolute: 0.4 10*3/uL (ref 0.1–1.0)
Monocytes Relative: 11.5 % (ref 3.0–12.0)
Neutro Abs: 1.4 10*3/uL (ref 1.4–7.7)
Neutrophils Relative %: 44.9 % (ref 43.0–77.0)
Platelets: 208 10*3/uL (ref 150.0–400.0)
RBC: 4.58 Mil/uL (ref 4.22–5.81)
RDW: 14.3 % (ref 11.5–15.5)
WBC: 3.2 10*3/uL — ABNORMAL LOW (ref 4.0–10.5)

## 2020-09-20 LAB — COMPREHENSIVE METABOLIC PANEL
ALT: 26 U/L (ref 0–53)
AST: 38 U/L — ABNORMAL HIGH (ref 0–37)
Albumin: 4.2 g/dL (ref 3.5–5.2)
Alkaline Phosphatase: 38 U/L — ABNORMAL LOW (ref 39–117)
BUN: 14 mg/dL (ref 6–23)
CO2: 29 mEq/L (ref 19–32)
Calcium: 9.3 mg/dL (ref 8.4–10.5)
Chloride: 104 mEq/L (ref 96–112)
Creatinine, Ser: 1.01 mg/dL (ref 0.40–1.50)
GFR: 88.64 mL/min (ref >60.00)
Glucose, Bld: 97 mg/dL (ref 70–99)
Potassium: 4.4 mEq/L (ref 3.5–5.1)
Sodium: 138 mEq/L (ref 135–145)
Total Bilirubin: 0.9 mg/dL (ref 0.2–1.2)
Total Protein: 6.7 g/dL (ref 6.0–8.3)

## 2020-09-20 LAB — PSA: PSA: 0.86 ng/mL (ref 0.10–4.00)

## 2020-09-20 LAB — LIPID PANEL
Cholesterol: 209 mg/dL — ABNORMAL HIGH (ref 0–200)
HDL: 48.9 mg/dL (ref >39.00)
LDL Cholesterol: 126 mg/dL — ABNORMAL HIGH (ref 0–99)
NonHDL: 159.74
Total CHOL/HDL Ratio: 4
Triglycerides: 169 mg/dL — ABNORMAL HIGH (ref 0.0–149.0)
VLDL: 33.8 mg/dL (ref 0.0–40.0)

## 2020-09-20 NOTE — Patient Instructions (Addendum)
For you wellness exam today I have ordered cbc, cmp  and  lipid panel. psa and hiv included in labs.  Vaccine up to date.  Recommend exercise and healthy diet.  We will let you know lab results as they come in.  Follow up date appointment will be determined after lab review.    Preventive Care 85-47 Years Old, Male Preventive care refers to lifestyle choices and visits with your health care provider that can promote health and wellness. This includes:  A yearly physical exam. This is also called an annual wellness visit.  Regular dental and eye exams.  Immunizations.  Screening for certain conditions.  Healthy lifestyle choices, such as: ? Eating a healthy diet. ? Getting regular exercise. ? Not using drugs or products that contain nicotine and tobacco. ? Limiting alcohol use. What can I expect for my preventive care visit? Physical exam Your health care provider will check your:  Height and weight. These may be used to calculate your BMI (body mass index). BMI is a measurement that tells if you are at a healthy weight.  Heart rate and blood pressure.  Body temperature.  Skin for abnormal spots. Counseling Your health care provider may ask you questions about your:  Past medical problems.  Family's medical history.  Alcohol, tobacco, and drug use.  Emotional well-being.  Home life and relationship well-being.  Sexual activity.  Diet, exercise, and sleep habits.  Work and work Statistician.  Access to firearms. What immunizations do I need? Vaccines are usually given at various ages, according to a schedule. Your health care provider will recommend vaccines for you based on your age, medical history, and lifestyle or other factors, such as travel or where you work.   What tests do I need? Blood tests  Lipid and cholesterol levels. These may be checked every 5 years, or more often if you are over 51 years old.  Hepatitis C test.  Hepatitis B  test. Screening  Lung cancer screening. You may have this screening every year starting at age 69 if you have a 30-pack-year history of smoking and currently smoke or have quit within the past 15 years.  Prostate cancer screening. Recommendations will vary depending on your family history and other risks.  Genital exam to check for testicular cancer or hernias.  Colorectal cancer screening. ? All adults should have this screening starting at age 19 and continuing until age 55. ? Your health care provider may recommend screening at age 52 if you are at increased risk. ? You will have tests every 1-10 years, depending on your results and the type of screening test.  Diabetes screening. ? This is done by checking your blood sugar (glucose) after you have not eaten for a while (fasting). ? You may have this done every 1-3 years.  STD (sexually transmitted disease) testing, if you are at risk. Follow these instructions at home: Eating and drinking  Eat a diet that includes fresh fruits and vegetables, whole grains, lean protein, and low-fat dairy products.  Take vitamin and mineral supplements as recommended by your health care provider.  Do not drink alcohol if your health care provider tells you not to drink.  If you drink alcohol: ? Limit how much you have to 0-2 drinks a day. ? Be aware of how much alcohol is in your drink. In the [place] one drink equals one 12 oz bottle of beer (355 mL), one 5 oz glass of wine (148 mL), or one 1 oz  glass of hard liquor (44 mL).   Lifestyle  Take daily care of your teeth and gums. Brush your teeth every morning and night with fluoride toothpaste. Floss one time each day.  Stay active. Exercise for at least 30 minutes 5 or more days each week.  Do not use any products that contain nicotine or tobacco, such as cigarettes, e-cigarettes, and chewing tobacco. If you need help quitting, ask your health care provider.  Do not use drugs.  If you are  sexually active, practice safe sex. Use a condom or other form of protection to prevent STIs (sexually transmitted infections).  If told by your health care provider, take low-dose aspirin daily starting at age 16.  Find healthy ways to cope with stress, such as: ? Meditation, yoga, or listening to music. ? Journaling. ? Talking to a trusted person. ? Spending time with friends and family. Safety  Always wear your seat belt while driving or riding in a vehicle.  Do not drive: ? If you have been drinking alcohol. Do not ride with someone who has been drinking. ? When you are tired or distracted. ? While texting.  Wear a helmet and other protective equipment during sports activities.  If you have firearms in your house, make sure you follow all gun safety procedures. What's next?  Go to your health care provider once a year for an annual wellness visit.  Ask your health care provider how often you should have your eyes and teeth checked.  Stay up to date on all vaccines. This information is not intended to replace advice given to you by your health care provider. Make sure you discuss any questions you have with your health care provider. Document Revised: 01/13/2019 Document Reviewed: 04/10/2018 Elsevier Patient Education  2021 [place].   Umbilical Hernia, Adult  A hernia is a bulge of tissue that pushes through an opening between muscles. An umbilical hernia happens in the abdomen, near the belly button (umbilicus). The hernia may contain tissues from the small intestine, large intestine, or fatty tissue covering the intestines (omentum). Umbilical hernias in adults tend to get worse over time, and they require surgical treatment. There are several types of umbilical hernias. You may have:  A hernia located just above or below the umbilicus (indirect hernia). This is the most common type of umbilical hernia in adults.  A hernia that forms through an opening formed by the  umbilicus (direct hernia).  A hernia that comes and goes (reducible hernia). A reducible hernia may be visible only when you strain, lift something heavy, or cough. This type of hernia can be pushed back into the abdomen (reduced).  A hernia that traps abdominal tissue inside the hernia (incarcerated hernia). This type of hernia cannot be reduced.  A hernia that cuts off blood flow to the tissues inside the hernia (strangulated hernia). The tissues can start to die if this happens. This type of hernia requires emergency treatment. What are the causes? An umbilical hernia happens when tissue inside the abdomen presses on a weak area of the abdominal muscles. What increases the risk? You may have a greater risk of this condition if you:  Are obese.  Have had several pregnancies.  Have a buildup of fluid inside your abdomen (ascites).  Have had surgery that weakens the abdominal muscles. What are the signs or symptoms? The main symptom of this condition is a painless bulge at or near the belly button. A reducible hernia may be visible only  when you strain, lift something heavy, or cough. Other symptoms may include:  Dull pain.  A feeling of pressure. Symptoms of a strangulated hernia may include:  Pain that gets increasingly worse.  Nausea and vomiting.  Pain when pressing on the hernia.  Skin over the hernia becoming red or purple.  Constipation.  Blood in the stool. How is this diagnosed? This condition may be diagnosed based on:  A physical exam. You may be asked to cough or strain while standing. These actions increase the pressure inside your abdomen and force the hernia through the opening in your muscles. Your health care provider may try to reduce the hernia by pressing on it.  Your symptoms and medical history. How is this treated? Surgery is the only treatment for an umbilical hernia. Surgery for a strangulated hernia is done as soon as possible. If you have a  small hernia that is not incarcerated, you may need to lose weight before having surgery. Follow these instructions at home:  Lose weight, if told by your health care provider.  Do not try to push the hernia back in.  Watch your hernia for any changes in color or size. Tell your health care provider if any changes occur.  You may need to avoid activities that increase pressure on your hernia.  Do not lift anything that is heavier than 10 lb (4.5 kg) until your health care provider says that this is safe.  Take over-the-counter and prescription medicines only as told by your health care provider.  Keep all follow-up visits as told by your health care provider. This is important. Contact a health care provider if:  Your hernia gets larger.  Your hernia becomes painful. Get help right away if:  You develop sudden, severe pain near the area of your hernia.  You have pain as well as nausea or vomiting.  You have pain and the skin over your hernia changes color.  You develop a fever. This information is not intended to replace advice given to you by your health care provider. Make sure you discuss any questions you have with your health care provider. Document Revised: 05/29/2017 Document Reviewed: 10/15/2016 Elsevier Patient Education  [place].

## 2020-09-20 NOTE — Progress Notes (Signed)
Subjective:    Patient ID: Ray Lopez, male    DOB: 1975-06-24, 45 y.o.   MRN: [id number]  HPI  Pt in for cpe/wellness.  Pt states work busy. Pt has been exercising twice a week. Cardio and weight lifting. Pt states he is eating healthy. Nonsmoker. Alcohol on weekends. 1-2 bottle of wine.  2 cups of coffee a day.   Pt has not colonoscopy. He declined last visit. Willing to get today.    Review of Systems  Constitutional: Negative for chills, fatigue and fever.  HENT: Negative for congestion, ear discharge and ear pain.   Respiratory: Negative for chest tightness, shortness of breath and wheezing.   Cardiovascular: Negative for chest pain and palpitations.  Gastrointestinal: Negative for abdominal distention, abdominal pain, blood in stool, constipation, diarrhea and nausea.  Genitourinary: Negative for dysuria.  Musculoskeletal: Negative for back pain.  Skin: Negative for rash.  Neurological: Negative for dizziness, seizures, syncope, weakness and headaches.  Hematological: Negative for adenopathy. Does not bruise/bleed easily.  Psychiatric/Behavioral: Negative for behavioral problems and confusion.    Past Medical History:  Diagnosis Date  . Allergic rhinitis   . Asthma   . Blood pressure elevated without history of HTN   . Chicken pox   . Genital herpes   . Low testosterone      Social History   Socioeconomic History  . Marital status: Married    Spouse name: Not on file  . Number of children: Not on file  . Years of education: Not on file  . Highest education level: Not on file  Occupational History  . Not on file  Tobacco Use  . Smoking status: Never Smoker  . Smokeless tobacco: Never Used  Substance and Sexual Activity  . Alcohol use: Yes    Alcohol/week: 6.0 standard drinks    Types: 6 Cans of beer per week  . Drug use: No  . Sexual activity: Yes    Birth control/protection: Surgical  Other Topics Concern  . Not on file  Social History  Narrative   Freight forwarder for [employer]   Married over 21 years   42 yr old daughter         Social Determinants of Health   Financial Resource Strain: Not on file  Food Insecurity: Not on file  Transportation Needs: Not on file  Physical Activity: Not on file  Stress: Not on file  Social Connections: Not on file  Intimate Partner Violence: Not on file    Past Surgical History:  Procedure Laterality Date  . REFRACTIVE SURGERY  4/13   lasix eye surgery  . VASECTOMY  10/2010    Family History  Problem Relation Age of Onset  . Diabetes Maternal Grandmother   . Hypertension Mother        Living  . Hyperlipidemia Mother   . Diverticulitis Mother   . Healthy Father        Living  . Diabetes Brother   . Healthy Sister   . Allergies Daughter   . Coronary artery disease Neg Hx   . Stroke Neg Hx   . Breast cancer Neg Hx   . Colon cancer Neg Hx   . Prostate cancer Neg Hx     Allergies  Allergen Reactions  . Shellfish Allergy Anaphylaxis    Current Outpatient Medications on File Prior to Visit  Medication Sig Dispense Refill  . Multiple Vitamins-Minerals (MULTIVITAMIN PO) Take by mouth daily. [facility] MVI     No  current facility-administered medications on file prior to visit.    BP 123/79   Pulse 62   Temp 98.1 F (36.7 C)   Resp 18   Ht 5\' 9"  (1.753 m)   Wt 220 lb 6.4 oz (100 kg)   SpO2 100%   BMI 32.55 kg/m       Objective:   Physical Exam  General Mental Status- Alert. General Appearance- Not in acute distress.   Skin General: Color- Normal Color. Moisture- Normal Moisture.  Neck Carotid Arteries- Normal color. Moisture- Normal Moisture. No carotid bruits. No JVD.  Chest and Lung Exam Auscultation: Breath Sounds:-Normal.  Cardiovascular Auscultation:Rythm- Regular. Murmurs & Other Heart Sounds:Auscultation of the heart reveals- No Murmurs.  Abdomen Inspection:-Inspeection Normal. Palpation/Percussion:Note:No mass. Palpation and Percussion  of the abdomen reveal- small defect at umbilicus but don't see any obvious hernia presently.  Non Distended + BS, no rebound or guarding.   Neurologic Cranial Nerve exam:- CN III-XII intact(No nystagmus), symmetric smile. Strength:- 5/5 equal and symmetric strength both upper and lower extremities.      Assessment & Plan:  For you wellness exam today I have ordered cbc, cmp and  lipid panel.  Vaccine up to date.  Recommend exercise and healthy diet.  We will let you know lab results as they come in.  Follow up date appointment will be determined after lab review.

## 2020-09-21 LAB — HIV ANTIBODY (ROUTINE TESTING W REFLEX): HIV 1&2 Ab, 4th Generation: NONREACTIVE

## 2020-10-12 DIAGNOSIS — M9903 Segmental and somatic dysfunction of lumbar region: Secondary | ICD-10-CM | POA: Diagnosis not present

## 2020-10-12 DIAGNOSIS — M9904 Segmental and somatic dysfunction of sacral region: Secondary | ICD-10-CM | POA: Diagnosis not present

## 2020-10-12 DIAGNOSIS — M9902 Segmental and somatic dysfunction of thoracic region: Secondary | ICD-10-CM | POA: Diagnosis not present

## 2020-10-12 DIAGNOSIS — M5386 Other specified dorsopathies, lumbar region: Secondary | ICD-10-CM | POA: Diagnosis not present

## 2020-10-13 DIAGNOSIS — M9902 Segmental and somatic dysfunction of thoracic region: Secondary | ICD-10-CM | POA: Diagnosis not present

## 2020-10-13 DIAGNOSIS — M5386 Other specified dorsopathies, lumbar region: Secondary | ICD-10-CM | POA: Diagnosis not present

## 2020-10-13 DIAGNOSIS — M9903 Segmental and somatic dysfunction of lumbar region: Secondary | ICD-10-CM | POA: Diagnosis not present

## 2020-10-13 DIAGNOSIS — M9904 Segmental and somatic dysfunction of sacral region: Secondary | ICD-10-CM | POA: Diagnosis not present

## 2020-10-17 DIAGNOSIS — M9902 Segmental and somatic dysfunction of thoracic region: Secondary | ICD-10-CM | POA: Diagnosis not present

## 2020-10-17 DIAGNOSIS — M9903 Segmental and somatic dysfunction of lumbar region: Secondary | ICD-10-CM | POA: Diagnosis not present

## 2020-10-17 DIAGNOSIS — M5386 Other specified dorsopathies, lumbar region: Secondary | ICD-10-CM | POA: Diagnosis not present

## 2020-10-17 DIAGNOSIS — M9904 Segmental and somatic dysfunction of sacral region: Secondary | ICD-10-CM | POA: Diagnosis not present

## 2020-10-19 DIAGNOSIS — M9903 Segmental and somatic dysfunction of lumbar region: Secondary | ICD-10-CM | POA: Diagnosis not present

## 2020-10-19 DIAGNOSIS — M9902 Segmental and somatic dysfunction of thoracic region: Secondary | ICD-10-CM | POA: Diagnosis not present

## 2020-10-19 DIAGNOSIS — M5386 Other specified dorsopathies, lumbar region: Secondary | ICD-10-CM | POA: Diagnosis not present

## 2020-10-19 DIAGNOSIS — M9904 Segmental and somatic dysfunction of sacral region: Secondary | ICD-10-CM | POA: Diagnosis not present

## 2020-10-21 DIAGNOSIS — M5386 Other specified dorsopathies, lumbar region: Secondary | ICD-10-CM | POA: Diagnosis not present

## 2020-10-21 DIAGNOSIS — M9903 Segmental and somatic dysfunction of lumbar region: Secondary | ICD-10-CM | POA: Diagnosis not present

## 2020-10-21 DIAGNOSIS — M9902 Segmental and somatic dysfunction of thoracic region: Secondary | ICD-10-CM | POA: Diagnosis not present

## 2020-10-21 DIAGNOSIS — M9904 Segmental and somatic dysfunction of sacral region: Secondary | ICD-10-CM | POA: Diagnosis not present

## 2020-11-11 DIAGNOSIS — M9903 Segmental and somatic dysfunction of lumbar region: Secondary | ICD-10-CM | POA: Diagnosis not present

## 2020-11-11 DIAGNOSIS — M9902 Segmental and somatic dysfunction of thoracic region: Secondary | ICD-10-CM | POA: Diagnosis not present

## 2020-11-11 DIAGNOSIS — M9904 Segmental and somatic dysfunction of sacral region: Secondary | ICD-10-CM | POA: Diagnosis not present

## 2020-11-11 DIAGNOSIS — M5386 Other specified dorsopathies, lumbar region: Secondary | ICD-10-CM | POA: Diagnosis not present

## 2020-11-22 DIAGNOSIS — M5386 Other specified dorsopathies, lumbar region: Secondary | ICD-10-CM | POA: Diagnosis not present

## 2020-11-22 DIAGNOSIS — M9903 Segmental and somatic dysfunction of lumbar region: Secondary | ICD-10-CM | POA: Diagnosis not present

## 2020-11-22 DIAGNOSIS — M9902 Segmental and somatic dysfunction of thoracic region: Secondary | ICD-10-CM | POA: Diagnosis not present

## 2020-11-22 DIAGNOSIS — M9904 Segmental and somatic dysfunction of sacral region: Secondary | ICD-10-CM | POA: Diagnosis not present

## 2020-11-29 ENCOUNTER — Encounter: Payer: BC Managed Care – PPO | Admitting: Gastroenterology

## 2020-12-06 ENCOUNTER — Other Ambulatory Visit: Payer: Self-pay

## 2020-12-06 ENCOUNTER — Ambulatory Visit (AMBULATORY_SURGERY_CENTER): Payer: BC Managed Care – PPO | Admitting: *Deleted

## 2020-12-06 VITALS — Ht 69.0 in | Wt 220.0 lb

## 2020-12-06 DIAGNOSIS — Z1211 Encounter for screening for malignant neoplasm of colon: Secondary | ICD-10-CM

## 2020-12-06 MED ORDER — CLENPIQ 10-3.5-12 MG-GM -GM/160ML PO SOLN: 1.0000 | Freq: Once | ORAL | 0 refills | Status: AC

## 2020-12-06 NOTE — Progress Notes (Signed)
Pt's previsit is done over the phone and all paperwork (prep instructions, blank consent form to just read over) sent to patient.  Pt's name and DOB verified at the beginning of the previsit.  Pt denies any difficulty with ambulating.   No trouble with anesthesia, denies trouble moving neck, or hx/fam hx of malignant hyperthermia per pt   No egg or soy allergy  No home oxygen use   No medications for weight loss taken  emmi information given  Pt denies constipation issues  Pt informed that we do not do prior authorizations for prep

## 2020-12-16 ENCOUNTER — Encounter: Payer: Self-pay | Admitting: Gastroenterology

## 2020-12-20 ENCOUNTER — Ambulatory Visit (AMBULATORY_SURGERY_CENTER): Payer: BC Managed Care – PPO | Admitting: Gastroenterology

## 2020-12-20 ENCOUNTER — Encounter: Payer: Self-pay | Admitting: Gastroenterology

## 2020-12-20 ENCOUNTER — Other Ambulatory Visit: Payer: Self-pay

## 2020-12-20 VITALS — BP 118/79 | HR 50 | Temp 96.6°F | Resp 14 | Ht 69.0 in | Wt 220.0 lb

## 2020-12-20 DIAGNOSIS — D128 Benign neoplasm of rectum: Secondary | ICD-10-CM

## 2020-12-20 DIAGNOSIS — D120 Benign neoplasm of cecum: Secondary | ICD-10-CM

## 2020-12-20 DIAGNOSIS — Z1211 Encounter for screening for malignant neoplasm of colon: Secondary | ICD-10-CM | POA: Diagnosis not present

## 2020-12-20 DIAGNOSIS — K621 Rectal polyp: Secondary | ICD-10-CM | POA: Diagnosis not present

## 2020-12-20 DIAGNOSIS — K5730 Diverticulosis of large intestine without perforation or abscess without bleeding: Secondary | ICD-10-CM

## 2020-12-20 MED ORDER — SODIUM CHLORIDE 0.9 % IV SOLN: 500.0000 mL | INTRAVENOUS | Status: DC

## 2020-12-20 NOTE — Patient Instructions (Signed)
Please read handouts provided. Continue present medications. Await pathology results. Consider a fiber supplement.    YOU HAD AN ENDOSCOPIC PROCEDURE TODAY AT [facility]:   Refer to the procedure report that was given to you for any specific questions about what was found during the examination.  If the procedure report does not answer your questions, please call your gastroenterologist to clarify.  If you requested that your care partner not be given the details of your procedure findings, then the procedure report has been included in a sealed envelope for you to review at your convenience later.  YOU SHOULD EXPECT: Some feelings of bloating in the abdomen. Passage of more gas than usual.  Walking can help get rid of the air that was put into your GI tract during the procedure and reduce the bloating. If you had a lower endoscopy (such as a colonoscopy or flexible sigmoidoscopy) you may notice spotting of blood in your stool or on the toilet paper. If you underwent a bowel prep for your procedure, you may not have a normal bowel movement for a few days.  Please Note:  You might notice some irritation and congestion in your nose or some drainage.  This is from the oxygen used during your procedure.  There is no need for concern and it should clear up in a day or so.  SYMPTOMS TO REPORT IMMEDIATELY:  Following lower endoscopy (colonoscopy or flexible sigmoidoscopy):  Excessive amounts of blood in the stool  Significant tenderness or worsening of abdominal pains  Swelling of the abdomen that is new, acute  Fever of 100F or higher    For urgent or emergent issues, a gastroenterologist can be reached at any hour by calling [phone]. Do not use MyChart messaging for urgent concerns.    DIET:  We do recommend a small meal at first, but then you may proceed to your regular diet.  Drink plenty of fluids but you should avoid alcoholic beverages for 24 hours.  ACTIVITY:   You should plan to take it easy for the rest of today and you should NOT DRIVE or use heavy machinery until tomorrow (because of the sedation medicines used during the test).    FOLLOW UP: Our staff will call the number listed on your records 48-72 hours following your procedure to check on you and address any questions or concerns that you may have regarding the information given to you following your procedure. If we do not reach you, we will leave a message.  We will attempt to reach you two times.  During this call, we will ask if you have developed any symptoms of COVID 19. If you develop any symptoms (ie: fever, flu-like symptoms, shortness of breath, cough etc.) before then, please call [phone].  If you test positive for Covid 19 in the 2 weeks post procedure, please call and report this information to [place].    If any biopsies were taken you will be contacted by phone or by letter within the next 1-3 weeks.  Please call us at [phone] if you have not heard about the biopsies in 3 weeks.    SIGNATURES/CONFIDENTIALITY: You and/or your care partner have signed paperwork which will be entered into your electronic medical record.  These signatures attest to the fact that that the information above on your After Visit Summary has been reviewed and is understood.  Full responsibility of the confidentiality of this discharge information lies with you and/or your care-partner.

## 2020-12-20 NOTE — Progress Notes (Signed)
VS by DT  Pt's states no medical or surgical changes since previsit or office visit.  

## 2020-12-20 NOTE — Progress Notes (Signed)
A and O x3. Report to RN. Tolerated MAC anesthesia well. 

## 2020-12-20 NOTE — Progress Notes (Signed)
GASTROENTEROLOGY PROCEDURE H&P NOTE   Primary Care Physician: Mackie Pai, PA-C    Reason for Procedure:   Colon cancer screening  Plan:    Colonoscopy  Patient is appropriate for endoscopic procedure(s) in the ambulatory ([place]) setting.  The nature of the procedure, as well as the risks, benefits, and alternatives were carefully and thoroughly reviewed with the patient. Ample time for discussion and questions allowed. The patient understood, was satisfied, and agreed to proceed.     HPI: Ray Lopez is a 45 y.o. male who presents for initial CRC screening colonoscopy. No previous colonoscopy. No active GI sxs and no known Fhx of CRC.   Past Medical History:  Diagnosis Date   Allergic rhinitis    Asthma    Blood pressure elevated without history of HTN    Chicken pox    Genital herpes    Low testosterone     Past Surgical History:  Procedure Laterality Date   REFRACTIVE SURGERY  4/13   lasix eye surgery   VASECTOMY  10/2010    Prior to Admission medications   Medication Sig Start Date End Date Taking? Authorizing Provider  Multiple Vitamins-Minerals (MULTIVITAMIN PO) Take by mouth daily. [facility]   Yes [provider]  valACYclovir HCl (VALTREX PO) Take by mouth daily as needed.    [provider]    Current Outpatient Medications  Medication Sig Dispense Refill   Multiple Vitamins-Minerals (MULTIVITAMIN PO) Take by mouth daily. [facility]     valACYclovir HCl (VALTREX PO) Take by mouth daily as needed.     Current Facility-Administered Medications  Medication Dose Route Frequency Provider Last Rate Last Admin   0.9 %  sodium chloride infusion  500 mL Intravenous Continuous Sejla Marzano V, DO        Allergies as of 12/20/2020 - Review Complete 12/20/2020  Allergen Reaction Noted   Shellfish allergy Anaphylaxis 09/24/2013    Family History  Problem Relation Age of Onset   Hypertension Mother        Living    Hyperlipidemia Mother    Diverticulitis Mother    Healthy Father        Living   Healthy Sister    Diabetes Brother    Diabetes Maternal Grandmother    Allergies Daughter    Coronary artery disease Neg Hx    Stroke Neg Hx    Breast cancer Neg Hx    Colon cancer Neg Hx    Prostate cancer Neg Hx    Esophageal cancer Neg Hx    Stomach cancer Neg Hx    Rectal cancer Neg Hx     Social History   Socioeconomic History   Marital status: Married    Spouse name: Not on file   Number of children: Not on file   Years of education: Not on file   Highest education level: Not on file  Occupational History   Not on file  Tobacco Use   Smoking status: Never   Smokeless tobacco: Never  Vaping Use   Vaping Use: Never used  Substance and Sexual Activity   Alcohol use: Yes    Alcohol/week: 6.0 standard drinks    Types: 6 Cans of beer per week   Drug use: No   Sexual activity: Yes    Birth control/protection: Surgical  Other Topics Concern   Not on file  Social History Science writer for [employer]   Married over 12 years  92 yr old daughter         Social Determinants of Radio broadcast assistant Strain: Not on file  Food Insecurity: Not on file  Transportation Needs: Not on file  Physical Activity: Not on file  Stress: Not on file  Social Connections: Not on file  Intimate Partner Violence: Not on file    Physical Exam: Vital signs in last 24 hours: '@BP'$  126/72   Pulse 67   Temp (!) 96.6 F (35.9 C)   Ht '5\' 9"'$  (1.753 m)   Wt 220 lb (99.8 kg)   SpO2 98%   BMI 32.49 kg/m  GEN: NAD EYE: Sclerae anicteric ENT: MMM CV: Non-tachycardic Pulm: CTA b/l GI: Soft, NT/ND NEURO:  Alert & Oriented x 3   Gerrit Heck, DO [facility]   12/20/2020 8:36 AM

## 2020-12-20 NOTE — Progress Notes (Signed)
Called to room to assist during endoscopic procedure.  Patient ID and intended procedure confirmed with present staff. Received instructions for my participation in the procedure from the performing physician.  

## 2020-12-20 NOTE — Op Note (Signed)
[facility] Patient Name: Ray Lopez Procedure Date: 12/20/2020 8:39 AM MRN: [id number] Endoscopist: Gerrit Heck , MD Age: 45 Referring MD:  Date of Birth: 1976-04-11 Gender: Male Account #: [id number] Procedure:                Colonoscopy Indications:              Screening for colorectal malignant neoplasm, This                            is the patient's first colonoscopy Medicines:                Monitored Anesthesia Care Procedure:                Pre-Anesthesia Assessment:                           - Prior to the procedure, a History and Physical                            was performed, and patient medications and                            allergies were reviewed. The patient's tolerance of                            previous anesthesia was also reviewed. The risks                            and benefits of the procedure and the sedation                            options and risks were discussed with the patient.                            All questions were answered, and informed consent                            was obtained. Prior Anticoagulants: The patient has                            taken no previous anticoagulant or antiplatelet                            agents. ASA Grade Assessment: II - A patient with                            mild systemic disease. After reviewing the risks                            and benefits, the patient was deemed in                            satisfactory condition to undergo the procedure.  After obtaining informed consent, the colonoscope                            was passed under direct vision. Throughout the                            procedure, the patient's blood pressure, pulse, and                            oxygen saturations were monitored continuously. The                            CF [id number] was introduced through the anus                            and advanced to the  the cecum, identified by                            appendiceal orifice and ileocecal valve. The                            colonoscopy was performed without difficulty. The                            patient tolerated the procedure well. The quality                            of the bowel preparation was good. The ileocecal                            valve, appendiceal orifice, and rectum were                            photographed. Scope In: 8:44:49 AM Scope Out: 9:03:41 AM Scope Withdrawal Time: 0 hours 14 minutes 43 seconds  Total Procedure Duration: 0 hours 18 minutes 52 seconds  Findings:                 The perianal and digital rectal examinations were                            normal.                           Two sessile polyps were found in the cecum. The                            polyps were 3 to 5 mm in size. These polyps were                            removed with a cold snare. Resection and retrieval                            were complete. Estimated blood loss was minimal.  Two sessile polyps were found in the rectum. The                            polyps were 2 to 3 mm in size. These polyps were                            removed with a cold snare. Resection and retrieval                            were complete. Estimated blood loss was minimal.                           Many small and large-mouthed diverticula were found                            in the entire colon.                           The retroflexed view of the distal rectum and anal                            verge was normal and showed no anal or rectal                            abnormalities. Complications:            No immediate complications. Estimated Blood Loss:     Estimated blood loss was minimal. Impression:               - Two 3 to 5 mm polyps in the cecum, removed with a                            cold snare. Resected and retrieved.                           - Two 2  to 3 mm polyps in the rectum, removed with                            a cold snare. Resected and retrieved.                           - Diverticulosis in the entire examined colon.                           - The distal rectum and anal verge are normal on                            retroflexion view. Recommendation:           - Patient has a contact number available for                            emergencies. The signs and symptoms of potential  delayed complications were discussed with the                            patient. Return to normal activities tomorrow.                            Written discharge instructions were provided to the                            patient.                           - Resume previous diet.                           - Continue present medications.                           - Await pathology results.                           - Repeat colonoscopy for surveillance based on                            pathology results.                           - Use fiber, for example Citrucel, Fibercon, Konsyl                            or Metamucil.                           - Return to GI clinic PRN. Gerrit Heck, MD 12/20/2020 9:09:18 AM

## 2020-12-22 ENCOUNTER — Encounter: Payer: Self-pay | Admitting: Gastroenterology

## 2020-12-22 ENCOUNTER — Telehealth: Payer: Self-pay

## 2020-12-22 NOTE — Telephone Encounter (Signed)
  Follow up Call-  Call back number 12/20/2020  Post procedure Call Back phone  # [phone]  Permission to leave phone message Yes  Some recent data might be hidden     Patient questions:  Do you have a fever, pain , or abdominal swelling? No. Pain Score  0 *  Have you tolerated food without any problems? Yes.    Have you been able to return to your normal activities? Yes.    Do you have any questions about your discharge instructions: Diet   No. Medications  No. Follow up visit  No.  Do you have questions or concerns about your Care? No.  Actions: * If pain score is 4 or above: No action needed, pain <4.

## 2021-09-18 DIAGNOSIS — F4320 Adjustment disorder, unspecified: Secondary | ICD-10-CM | POA: Diagnosis not present

## 2021-09-26 ENCOUNTER — Ambulatory Visit (INDEPENDENT_AMBULATORY_CARE_PROVIDER_SITE_OTHER): Payer: BC Managed Care – PPO | Admitting: Medical

## 2021-09-26 VITALS — BP 115/70 | HR 58 | Temp 98.2°F | Resp 18 | Ht 69.0 in | Wt 218.0 lb

## 2021-09-26 DIAGNOSIS — Z125 Encounter for screening for malignant neoplasm of prostate: Secondary | ICD-10-CM | POA: Diagnosis not present

## 2021-09-26 DIAGNOSIS — Z0000 Encounter for general adult medical examination without abnormal findings: Secondary | ICD-10-CM | POA: Diagnosis not present

## 2021-09-26 DIAGNOSIS — R739 Hyperglycemia, unspecified: Secondary | ICD-10-CM | POA: Diagnosis not present

## 2021-09-26 LAB — COMPREHENSIVE METABOLIC PANEL
ALT: 48 U/L (ref 0–53)
AST: 33 U/L (ref 0–37)
Albumin: 4.5 g/dL (ref 3.5–5.2)
Alkaline Phosphatase: 39 U/L (ref 39–117)
BUN: 14 mg/dL (ref 6–23)
CO2: 30 mEq/L (ref 19–32)
Calcium: 9.8 mg/dL (ref 8.4–10.5)
Chloride: 103 mEq/L (ref 96–112)
Creatinine, Ser: 1.11 mg/dL (ref 0.40–1.50)
GFR: 78.58 mL/min (ref >60.00)
Glucose, Bld: 101 mg/dL — ABNORMAL HIGH (ref 70–99)
Potassium: 4.5 mEq/L (ref 3.5–5.1)
Sodium: 139 mEq/L (ref 135–145)
Total Bilirubin: 0.7 mg/dL (ref 0.2–1.2)
Total Protein: 7 g/dL (ref 6.0–8.3)

## 2021-09-26 LAB — LIPID PANEL
Cholesterol: 201 mg/dL — ABNORMAL HIGH (ref 0–200)
HDL: 57.4 mg/dL (ref >39.00)
LDL Cholesterol: 117 mg/dL — ABNORMAL HIGH (ref 0–99)
NonHDL: 143.91
Total CHOL/HDL Ratio: 4
Triglycerides: 135 mg/dL (ref 0.0–149.0)
VLDL: 27 mg/dL (ref 0.0–40.0)

## 2021-09-26 LAB — HEMOGLOBIN A1C: Hgb A1c MFr Bld: 5.6 % (ref 4.6–6.5)

## 2021-09-26 LAB — PSA: PSA: 0.95 ng/mL (ref 0.10–4.00)

## 2021-09-26 LAB — CBC WITH DIFFERENTIAL/PLATELET
Basophils Absolute: 0 10*3/uL (ref 0.0–0.1)
Basophils Relative: 0.8 % (ref 0.0–3.0)
Eosinophils Absolute: 0.1 10*3/uL (ref 0.0–0.7)
Eosinophils Relative: 2.7 % (ref 0.0–5.0)
HCT: 45.5 % (ref 39.0–52.0)
Hemoglobin: 15.2 g/dL (ref 13.0–17.0)
Lymphocytes Relative: 38.6 % (ref 12.0–46.0)
Lymphs Abs: 1.3 10*3/uL (ref 0.7–4.0)
MCHC: 33.4 g/dL (ref 30.0–36.0)
MCV: 95.7 fl (ref 78.0–100.0)
Monocytes Absolute: 0.5 10*3/uL (ref 0.1–1.0)
Monocytes Relative: 13.4 % — ABNORMAL HIGH (ref 3.0–12.0)
Neutro Abs: 1.5 10*3/uL (ref 1.4–7.7)
Neutrophils Relative %: 44.5 % (ref 43.0–77.0)
Platelets: 242 10*3/uL (ref 150.0–400.0)
RBC: 4.76 Mil/uL (ref 4.22–5.81)
RDW: 14.1 % (ref 11.5–15.5)
WBC: 3.4 10*3/uL — ABNORMAL LOW (ref 4.0–10.5)

## 2021-09-26 NOTE — Patient Instructions (Addendum)
For you wellness exam today I have ordered cbc, cmp and  lipid panel.  Screening psa lab.   Check A1c/3 month blood sugar average due to mild elevated sugars pnast  Vaccines up to date.  Recommend exercise and healthy diet.  We will let you know lab results as they come in.  Follow up date appointment will be determined after lab review.    Preventive Care 43-48 Years Old, Male Preventive care refers to lifestyle choices and visits with your health care provider that can promote health and wellness. Preventive care visits are also called wellness exams. What can I expect for my preventive care visit? Counseling During your preventive care visit, your health care provider may ask about your: Medical history, including: Past medical problems. Family medical history. Current health, including: Emotional well-being. Home life and relationship well-being. Sexual activity. Lifestyle, including: Alcohol, nicotine or tobacco, and drug use. Access to firearms. Diet, exercise, and sleep habits. Safety issues such as seatbelt and bike helmet use. Sunscreen use. Work and work Statistician. Physical exam Your health care provider will check your: Height and weight. These may be used to calculate your BMI (body mass index). BMI is a measurement that tells if you are at a healthy weight. Waist circumference. This measures the distance around your waistline. This measurement also tells if you are at a healthy weight and may help predict your risk of certain diseases, such as type 2 diabetes and high blood pressure. Heart rate and blood pressure. Body temperature. Skin for abnormal spots. What immunizations do I need?  Vaccines are usually given at various ages, according to a schedule. Your health care provider will recommend vaccines for you based on your age, medical history, and lifestyle or other factors, such as travel or where you work. What tests do I need? Screening Your health  care provider may recommend screening tests for certain conditions. This may include: Lipid and cholesterol levels. Diabetes screening. This is done by checking your blood sugar (glucose) after you have not eaten for a while (fasting). Hepatitis B test. Hepatitis C test. HIV (human immunodeficiency virus) test. STI (sexually transmitted infection) testing, if you are at risk. Lung cancer screening. Prostate cancer screening. Colorectal cancer screening. Talk with your health care provider about your test results, treatment options, and if necessary, the need for more tests. Follow these instructions at home: Eating and drinking  Eat a diet that includes fresh fruits and vegetables, whole grains, lean protein, and low-fat dairy products. Take vitamin and mineral supplements as recommended by your health care provider. Do not drink alcohol if your health care provider tells you not to drink. If you drink alcohol: Limit how much you have to 0-2 drinks a day. Know how much alcohol is in your drink. In the [place] one drink equals one 12 oz bottle of beer (355 mL), one 5 oz glass of wine (148 mL), or one 1 oz glass of hard liquor (44 mL). Lifestyle Brush your teeth every morning and night with fluoride toothpaste. Floss one time each day. Exercise for at least 30 minutes 5 or more days each week. Do not use any products that contain nicotine or tobacco. These products include cigarettes, chewing tobacco, and vaping devices, such as e-cigarettes. If you need help quitting, ask your health care provider. Do not use drugs. If you are sexually active, practice safe sex. Use a condom or other form of protection to prevent STIs. Take aspirin only as told by your health care  provider. Make sure that you understand how much to take and what form to take. Work with your health care provider to find out whether it is safe and beneficial for you to take aspirin daily. Find healthy ways to manage stress,  such as: Meditation, yoga, or listening to music. Journaling. Talking to a trusted person. Spending time with friends and family. Minimize exposure to UV radiation to reduce your risk of skin cancer. Safety Always wear your seat belt while driving or riding in a vehicle. Do not drive: If you have been drinking alcohol. Do not ride with someone who has been drinking. When you are tired or distracted. While texting. If you have been using any mind-altering substances or drugs. Wear a helmet and other protective equipment during sports activities. If you have firearms in your house, make sure you follow all gun safety procedures. What's next? Go to your health care provider once a year for an annual wellness visit. Ask your health care provider how often you should have your eyes and teeth checked. Stay up to date on all vaccines. This information is not intended to replace advice given to you by your health care provider. Make sure you discuss any questions you have with your health care provider. Document Revised: 10/12/2020 Document Reviewed: 10/12/2020 Elsevier Patient Education  [place].

## 2021-09-26 NOTE — Progress Notes (Signed)
Subjective:    Patient ID: Ray Lopez, male    DOB: [birth year], 46 y.o.   MRN: [id number]  HPI  Pt in for cpe/wellness.   Pt states work busy. Pt has been exercising three times a week. Cardio and weight lifting. Pt states he is eating healthy. Nonsmoker. Alcohol on weekends. 1-2 bottle of wine.   2 cups of coffee a day.     Since last visit did get colonoscopy.  Some mild elevated sugar levels in past.  Review of Systems  Constitutional:  Negative for chills, fatigue and fever.  HENT:  Negative for congestion, ear discharge and ear pain.   Respiratory:  Negative for chest tightness, shortness of breath and wheezing.   Cardiovascular:  Negative for chest pain and palpitations.  Gastrointestinal:  Negative for abdominal distention, abdominal pain, blood in stool, constipation, diarrhea and nausea.  Genitourinary:  Negative for dysuria.  Musculoskeletal:  Negative for back pain.  Skin:  Negative for rash.  Neurological:  Negative for dizziness, seizures, syncope, weakness and headaches.  Hematological:  Negative for adenopathy. Does not bruise/bleed easily.  Psychiatric/Behavioral:  Negative for behavioral problems and confusion.     Past Medical History:  Diagnosis Date   Allergic rhinitis    Asthma    Blood pressure elevated without history of HTN    Chicken pox    Genital herpes    Low testosterone      Social History   Socioeconomic History   Marital status: Married    Spouse name: Not on file   Number of children: Not on file   Years of education: Not on file   Highest education level: Not on file  Occupational History   Not on file  Tobacco Use   Smoking status: Never   Smokeless tobacco: Never  Vaping Use   Vaping Use: Never used  Substance and Sexual Activity   Alcohol use: Yes    Alcohol/week: 6.0 standard drinks    Types: 6 Cans of beer per week   Drug use: No   Sexual activity: Yes    Birth control/protection: Surgical  Other Topics  Concern   Not on file  Social History Science writer for [employer]   Married over 65 years   45 yr old daughter         Social Determinants of Health   Financial Resource Strain: Not on file  Food Insecurity: Not on file  Transportation Needs: Not on file  Physical Activity: Not on file  Stress: Not on file  Social Connections: Not on file  Intimate Partner Violence: Not on file    Past Surgical History:  Procedure Laterality Date   REFRACTIVE SURGERY  4/13   lasix eye surgery   VASECTOMY  10/2010    Family History  Problem Relation Age of Onset   Hypertension Mother        Living   Hyperlipidemia Mother    Diverticulitis Mother    Healthy Father        Living   Healthy Sister    Diabetes Brother    Diabetes Maternal Grandmother    Allergies Daughter    Coronary artery disease Neg Hx    Stroke Neg Hx    Breast cancer Neg Hx    Colon cancer Neg Hx    Prostate cancer Neg Hx    Esophageal cancer Neg Hx    Stomach cancer Neg Hx    Rectal cancer Neg Hx  Allergies  Allergen Reactions   Shellfish Allergy Anaphylaxis    Current Outpatient Medications on File Prior to Visit  Medication Sig Dispense Refill   Multiple Vitamins-Minerals (MULTIVITAMIN PO) Take by mouth daily. [employer] MENS MVI     valACYclovir HCl (VALTREX PO) Take by mouth daily as needed.     No current facility-administered medications on file prior to visit.    There were no vitals taken for this visit.      Objective:   Physical Exam   General Mental Status- Alert. General Appearance- Not in acute distress.   Skin General: Color- Normal Color. Moisture- Normal Moisture.  Neck Carotid Arteries- Normal color. Moisture- Normal Moisture. No carotid bruits. No JVD.  Chest and Lung Exam Auscultation: Breath Sounds:-Normal.  Cardiovascular Auscultation:Rythm- Regular. Murmurs & Other Heart Sounds:Auscultation of the heart reveals- No Murmurs.  Abdomen Inspection:-Inspeection  Normal. Palpation/Percussion:Note:No mass. Palpation and Percussion of the abdomen reveal- Non Tender, Non Distended + BS, no rebound or guarding.   Neurologic Cranial Nerve exam:- CN III-XII intact(No nystagmus), symmetric smile. etric strength both upper and lower extremities.     Assessment & Plan:   Patient Instructions  For you wellness exam today I have ordered cbc, cmp and  lipid panel.  Screening psa lab.   Check A1c/3 month blood sugar average due to mild elevated sugars pnast  Vaccines up to date.  Recommend exercise and healthy diet.  We will let you know lab results as they come in.  Follow up date appointment will be determined after lab review.       Mackie Pai, PA-C

## 2021-10-02 DIAGNOSIS — F4320 Adjustment disorder, unspecified: Secondary | ICD-10-CM | POA: Diagnosis not present

## 2021-10-10 DIAGNOSIS — F4320 Adjustment disorder, unspecified: Secondary | ICD-10-CM | POA: Diagnosis not present

## 2021-10-24 DIAGNOSIS — F4320 Adjustment disorder, unspecified: Secondary | ICD-10-CM | POA: Diagnosis not present

## 2021-11-06 DIAGNOSIS — F4320 Adjustment disorder, unspecified: Secondary | ICD-10-CM | POA: Diagnosis not present

## 2022-01-22 ENCOUNTER — Telehealth: Payer: BC Managed Care – PPO | Admitting: Physician Assistant

## 2022-01-22 DIAGNOSIS — J4520 Mild intermittent asthma, uncomplicated: Secondary | ICD-10-CM

## 2022-01-22 MED ORDER — ALBUTEROL SULFATE HFA 108 (90 BASE) MCG/ACT IN AERS: 1.0000 | INHALATION_SPRAY | Freq: Four times a day (QID) | RESPIRATORY_TRACT | 0 refills | Status: AC | PRN

## 2022-01-22 NOTE — Progress Notes (Signed)
Visit for Asthma  Based on what you have shared with me, it looks like you may have a flare up of your asthma.  Asthma is a chronic (ongoing) lung disease which results in airway obstruction, inflammation and hyper-responsiveness.   Asthma symptoms vary from person to person, with common symptoms including nighttime awakening and decreased ability to participate in normal activities as a result of shortness of breath. It is often triggered by changes in weather, changes in the season, changes in air temperature, or inside (home, school, daycare or work) allergens such as animal dander, mold, mildew, woodstoves or cockroaches.   It can also be triggered by hormonal changes, extreme emotion, physical exertion or an upper respiratory tract illness.     It is important to identify the trigger, and then eliminate or avoid the trigger if possible.   If you have been prescribed medications to be taken on a regular basis, it is important to follow the asthma action plan and to follow guidelines to adjust medication in response to increasing symptoms of decreased peak expiratory flow rate  Treatment: I have prescribed: Albuterol (Proventil HFA; Ventolin HFA) 108 (90 Base) MCG/ACT Inhaler 2 puffs into the lungs every six hours as needed for wheezing or shortness of breath  HOME CARE Only take medications as instructed by your medical team. Consider wearing a mask or scarf to improve breathing air temperature have been shown to decrease irritation and decrease exacerbations Get rest. Taking a steamy shower or using a humidifier may help nasal congestion sand ease sore throat pain. You can place a towel over your head and breathe in the steam from hot water coming from a faucet. Using a saline nasal spray works much the same way.  Cough drops, hare candies and sore throat lozenges may ease your  cough.  Avoid close contacts especially the very you and the elderly Cover your mouth if you cough or sneeze Always remember to wash your hands.    GET HELP RIGHT AWAY IF: You develop worsening symptoms; breathlessness at rest, drowsy, confused or agitated, unable to speak in full sentences You have coughing fits You develop a severe headache or visual changes You develop shortness of breath, difficulty breathing or start having chest pain Your symptoms persist after you have completed your treatment plan If your symptoms do not improve within 10 days  MAKE SURE YOU Understand these instructions. Will watch your condition. Will get help right away if you are not doing well or get worse.   Your e-visit answers were reviewed by a board certified advanced clinical practitioner to complete your personal care plan, Depending upon the condition, your plan could have included both over the counter or prescription medications.   Please review your pharmacy choice. Your safety is important to [place]. If you have drug allergies check your prescription carefully.  You can use MyChart to ask questions about today's visit, request a non-urgent  call back, or ask for a work or school excuse for 24 hours related to this e-Visit. If it has been greater than 24 hours you will need to follow up with your provider, or enter a new e-Visit to address those concerns.   You will get an e-mail in the next two days asking about your experience. I hope that your e-visit has been valuable and will speed your recovery. Thank you for using e-visits.  I provided 5 minutes of non face-to-face time during this encounter for chart review and documentation.

## 2022-04-03 DIAGNOSIS — M5386 Other specified dorsopathies, lumbar region: Secondary | ICD-10-CM | POA: Diagnosis not present

## 2022-04-03 DIAGNOSIS — M9903 Segmental and somatic dysfunction of lumbar region: Secondary | ICD-10-CM | POA: Diagnosis not present

## 2022-04-03 DIAGNOSIS — M9902 Segmental and somatic dysfunction of thoracic region: Secondary | ICD-10-CM | POA: Diagnosis not present

## 2022-04-03 DIAGNOSIS — M9904 Segmental and somatic dysfunction of sacral region: Secondary | ICD-10-CM | POA: Diagnosis not present

## 2022-04-06 DIAGNOSIS — M9904 Segmental and somatic dysfunction of sacral region: Secondary | ICD-10-CM | POA: Diagnosis not present

## 2022-04-06 DIAGNOSIS — M9902 Segmental and somatic dysfunction of thoracic region: Secondary | ICD-10-CM | POA: Diagnosis not present

## 2022-04-06 DIAGNOSIS — M5386 Other specified dorsopathies, lumbar region: Secondary | ICD-10-CM | POA: Diagnosis not present

## 2022-04-06 DIAGNOSIS — M9903 Segmental and somatic dysfunction of lumbar region: Secondary | ICD-10-CM | POA: Diagnosis not present

## 2022-04-13 DIAGNOSIS — M9902 Segmental and somatic dysfunction of thoracic region: Secondary | ICD-10-CM | POA: Diagnosis not present

## 2022-04-13 DIAGNOSIS — M9904 Segmental and somatic dysfunction of sacral region: Secondary | ICD-10-CM | POA: Diagnosis not present

## 2022-04-13 DIAGNOSIS — M9903 Segmental and somatic dysfunction of lumbar region: Secondary | ICD-10-CM | POA: Diagnosis not present

## 2022-04-13 DIAGNOSIS — M5386 Other specified dorsopathies, lumbar region: Secondary | ICD-10-CM | POA: Diagnosis not present

## 2022-06-08 DIAGNOSIS — M9904 Segmental and somatic dysfunction of sacral region: Secondary | ICD-10-CM | POA: Diagnosis not present

## 2022-06-08 DIAGNOSIS — M5386 Other specified dorsopathies, lumbar region: Secondary | ICD-10-CM | POA: Diagnosis not present

## 2022-06-08 DIAGNOSIS — M9902 Segmental and somatic dysfunction of thoracic region: Secondary | ICD-10-CM | POA: Diagnosis not present

## 2022-06-08 DIAGNOSIS — M9903 Segmental and somatic dysfunction of lumbar region: Secondary | ICD-10-CM | POA: Diagnosis not present

## 2022-07-10 DIAGNOSIS — M9902 Segmental and somatic dysfunction of thoracic region: Secondary | ICD-10-CM | POA: Diagnosis not present

## 2022-07-10 DIAGNOSIS — M9903 Segmental and somatic dysfunction of lumbar region: Secondary | ICD-10-CM | POA: Diagnosis not present

## 2022-07-10 DIAGNOSIS — M9904 Segmental and somatic dysfunction of sacral region: Secondary | ICD-10-CM | POA: Diagnosis not present

## 2022-07-10 DIAGNOSIS — M5386 Other specified dorsopathies, lumbar region: Secondary | ICD-10-CM | POA: Diagnosis not present

## 2022-07-19 ENCOUNTER — Ambulatory Visit: Payer: BC Managed Care – PPO | Admitting: Medical

## 2022-07-19 ENCOUNTER — Encounter: Payer: Self-pay | Admitting: Medical

## 2022-07-19 ENCOUNTER — Ambulatory Visit (HOSPITAL_BASED_OUTPATIENT_CLINIC_OR_DEPARTMENT_OTHER)
Admission: RE | Admit: 2022-07-19 | Discharge: 2022-07-19 | Disposition: A | Discharge status: Home / Self Care | Payer: BC Managed Care – PPO | Source: Ambulatory Visit | Attending: Medical | Admitting: Medical

## 2022-07-19 VITALS — BP 125/85 | HR 68 | Temp 98.0°F | Resp 18 | Ht 69.0 in | Wt 201.6 lb

## 2022-07-19 DIAGNOSIS — M545 Low back pain, unspecified: Secondary | ICD-10-CM | POA: Diagnosis not present

## 2022-07-19 DIAGNOSIS — G8929 Other chronic pain: Secondary | ICD-10-CM | POA: Diagnosis not present

## 2022-07-19 MED ORDER — CYCLOBENZAPRINE HCL 10 MG PO TABS: ORAL_TABLET | ORAL | 0 refills | Status: AC

## 2022-07-19 NOTE — Progress Notes (Signed)
Subjective:    Patient ID: Ray Lopez, male    DOB: [birth year], 48 y.o.   MRN: [id number]  HPI Pt in states he has been having on and off back pain for about one year. States chronic intermittent back pain. Has been seen about twice a month. Pt states they did xray.   Pt states February he picked up his book bag and had severe pain. Since then has been seeing chiropacter twice a week since then. He states still having significant pain since February. Pain level 5/10. The level of pain can vary. Most days pain is level 5/10.   No leg weakness. No pain shooting to his legs. No numbness to groin area/perineum.  Pt states just taking ibuprofen otc low dose 200 mg. Pt has tinze units over the counter.  Pt mentioned that chiropracter thought he would benefit from muscle relaxant.   Review of Systems  Constitutional:  Negative for chills, fatigue and fever.  Respiratory:  Negative for cough, chest tightness, shortness of breath and wheezing.   Cardiovascular:  Negative for chest pain and palpitations.  Gastrointestinal:  Negative for abdominal pain.  Genitourinary:  Negative for dysuria, flank pain and frequency.  Musculoskeletal:  Positive for back pain.  Skin:  Negative for rash.  Neurological:  Negative for dizziness, speech difficulty, weakness, numbness and headaches.  Hematological:  Negative for adenopathy. Does not bruise/bleed easily.  Psychiatric/Behavioral:  Negative for behavioral problems and confusion.     Past Medical History:  Diagnosis Date   Allergic rhinitis    Asthma    Blood pressure elevated without history of HTN    Chicken pox    Genital herpes    Low testosterone      Social History   Socioeconomic History   Marital status: Divorced    Spouse name: Not on file   Number of children: Not on file   Years of education: Not on file   Highest education level: Not on file  Occupational History   Not on file  Tobacco Use   Smoking status: Never    Smokeless tobacco: Never  Vaping Use   Vaping Use: Never used  Substance and Sexual Activity   Alcohol use: Yes    Alcohol/week: 6.0 standard drinks of alcohol    Types: 6 Cans of beer per week   Drug use: No   Sexual activity: Yes    Birth control/protection: Surgical  Other Topics Concern   Not on file  Social History Science writer for [employer]   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   43 yr old daughter         Social Determinants of Health   Financial Resource Strain: Not on file  Food Insecurity: Not on file  Transportation Needs: Not on file  Physical Activity: Not on file  Stress: Not on file  Social Connections: Not on file  Intimate Partner Violence: Not on file    Past Surgical History:  Procedure Laterality Date   REFRACTIVE SURGERY  4/13   lasix eye surgery   VASECTOMY  10/2010    Family History  Problem Relation Age of Onset   Hypertension Mother        Living   Hyperlipidemia Mother    Diverticulitis Mother    Healthy Father        Living   Healthy Sister    Diabetes Brother    Diabetes Maternal Grandmother    Allergies Daughter    Coronary artery  disease Neg Hx    Stroke Neg Hx    Breast cancer Neg Hx    Colon cancer Neg Hx    Prostate cancer Neg Hx    Esophageal cancer Neg Hx    Stomach cancer Neg Hx    Rectal cancer Neg Hx     Allergies  Allergen Reactions   Shellfish Allergy Anaphylaxis    Current Outpatient Medications on File Prior to Visit  Medication Sig Dispense Refill   albuterol (VENTOLIN HFA) 108 (90 Base) MCG/ACT inhaler Inhale 1-2 puffs into the lungs every 6 (six) hours as needed for wheezing or shortness of breath. 8 g 0   Multiple Vitamins-Minerals (MULTIVITAMIN PO) Take by mouth daily. [employer] MENS MVI     valACYclovir HCl (VALTREX PO) Take by mouth daily as needed.     No current facility-administered medications on file prior to visit.    BP 125/85   Pulse 68   Temp 98 F (36.7 C)   Resp 18   Ht 5\' 9"  (1.753 m)   Wt  201 lb 9.6 oz (91.4 kg)   SpO2 100%   BMI 29.77 kg/m      Objective:   Physical Exam  General Appearance- Not in acute distress.    Chest and Lung Exam Auscultation: Breath sounds:-Normal. Clear even and unlabored. Adventitious sounds:- No Adventitious sounds.  Cardiovascular Auscultation:Rythm - Regular, rate and rythm. Heart Sounds -Normal heart sounds.  Abdomen Inspection:-Inspection Normal.  Palpation/Perucssion: Palpation and Percussion of the abdomen reveal- Non Tender, No Rebound tenderness, No rigidity(Guarding) and No Palpable abdominal masses.  Liver:-Normal.  Spleen:- Normal.   Back Mid lumbar spine tenderness to palpation. Pain on straight leg lift. Pain on lateral movements and flexion/extension of the spine.  Lower ext neurologic  L5-S1 sensation intact bilaterally. Normal patellar reflexes bilaterally. No foot drop bilaterally.       Assessment & Plan:   Patient Instructions  Back pain for one year. Upper mid lumbar region on exam today. Paraspinal and some mid spinal.  Lumbar xray today.  Can use low dose ibuprofen in combination with tylenol. Short course flexeril to use at night.   Back stretching exercise.  If pain persists despite the above could offer sports med referral.  Follow up in 2-3 weeks or sooner if needed.     Mackie Pai, PA-C

## 2022-07-19 NOTE — Patient Instructions (Addendum)
Back pain for one year. Upper mid lumbar region on exam today. Paraspinal and some mid spinal.  Lumbar xray today.  Can use low dose ibuprofen in combination with tylenol. Short course flexeril to use at night.   Back stretching exercise.  If pain persists despite the above could offer sports med referral.  Follow up in 2-3 weeks or sooner if needed.  Back Exercises The following exercises strengthen the muscles that help to support the trunk (torso) and back. They also help to keep the lower back flexible. Doing these exercises can help to prevent or lessen existing low back pain. If you have back pain or discomfort, try doing these exercises 2-3 times each day or as told by your health care provider. As your pain improves, do them once each day, but increase the number of times that you repeat the steps for each exercise (do more repetitions). To prevent the recurrence of back pain, continue to do these exercises once each day or as told by your health care provider. Do exercises exactly as told by your health care provider and adjust them as directed. It is normal to feel mild stretching, pulling, tightness, or discomfort as you do these exercises, but you should stop right away if you feel sudden pain or your pain gets worse. Exercises Single knee to chest Repeat these steps 3-5 times for each leg: Lie on your back on a firm bed or the floor with your legs extended. Bring one knee to your chest. Your other leg should stay extended and in contact with the floor. Hold your knee in place by grabbing your knee or thigh with both hands and hold. Pull on your knee until you feel a gentle stretch in your lower back or buttocks. Hold the stretch for 10-30 seconds. Slowly release and straighten your leg.  Pelvic tilt Repeat these steps 5-10 times: Lie on your back on a firm bed or the floor with your legs extended. Bend your knees so they are pointing toward the ceiling and your feet are  flat on the floor. Tighten your lower abdominal muscles to press your lower back against the floor. This motion will tilt your pelvis so your tailbone points up toward the ceiling instead of pointing to your feet or the floor. With gentle tension and even breathing, hold this position for 5-10 seconds.  Cat-cow Repeat these steps until your lower back becomes more flexible: Get into a hands-and-knees position on a firm bed or the floor. Keep your hands under your shoulders, and keep your knees under your hips. You may place padding under your knees for comfort. Let your head hang down toward your chest. Contract your abdominal muscles and point your tailbone toward the floor so your lower back becomes rounded like the back of a cat. Hold this position for 5 seconds. Slowly lift your head, let your abdominal muscles relax, and point your tailbone up toward the ceiling so your back forms a sagging arch like the back of a cow. Hold this position for 5 seconds.  Press-ups Repeat these steps 5-10 times: Lie on your abdomen (face-down) on a firm bed or the floor. Place your palms near your head, about shoulder-width apart. Keeping your back as relaxed as possible and keeping your hips on the floor, slowly straighten your arms to raise the top half of your body and lift your shoulders. Do not use your back muscles to raise your upper torso. You may adjust the placement of your hands  to make yourself more comfortable. Hold this position for 5 seconds while you keep your back relaxed. Slowly return to lying flat on the floor.  Bridges Repeat these steps 10 times: Lie on your back on a firm bed or the floor. Bend your knees so they are pointing toward the ceiling and your feet are flat on the floor. Your arms should be flat at your sides, next to your body. Tighten your buttocks muscles and lift your buttocks off the floor until your waist is at almost the same height as your knees. You should feel the  muscles working in your buttocks and the back of your thighs. If you do not feel these muscles, slide your feet 1-2 inches (2.5-5 cm) farther away from your buttocks. Hold this position for 3-5 seconds. Slowly lower your hips to the starting position, and allow your buttocks muscles to relax completely. If this exercise is too easy, try doing it with your arms crossed over your chest. Abdominal crunches Repeat these steps 5-10 times: Lie on your back on a firm bed or the floor with your legs extended. Bend your knees so they are pointing toward the ceiling and your feet are flat on the floor. Cross your arms over your chest. Tip your chin slightly toward your chest without bending your neck. Tighten your abdominal muscles and slowly raise your torso high enough to lift your shoulder blades a tiny bit off the floor. Avoid raising your torso higher than that because it can put too much stress on your lower back and does not help to strengthen your abdominal muscles. Slowly return to your starting position.  Back lifts Repeat these steps 5-10 times: Lie on your abdomen (face-down) with your arms at your sides, and rest your forehead on the floor. Tighten the muscles in your legs and your buttocks. Slowly lift your chest off the floor while you keep your hips pressed to the floor. Keep the back of your head in line with the curve in your back. Your eyes should be looking at the floor. Hold this position for 3-5 seconds. Slowly return to your starting position.  Contact a health care provider if: Your back pain or discomfort gets much worse when you do an exercise. Your worsening back pain or discomfort does not lessen within 2 hours after you exercise. If you have any of these problems, stop doing these exercises right away. Do not do them again unless your health care provider says that you can. Get help right away if: You develop sudden, severe back pain. If this happens, stop doing the  exercises right away. Do not do them again unless your health care provider says that you can. This information is not intended to replace advice given to you by your health care provider. Make sure you discuss any questions you have with your health care provider. Document Revised: 10/11/2020 Document Reviewed: 06/29/2020 Elsevier Patient Education  [employer].

## 2022-09-28 ENCOUNTER — Ambulatory Visit (INDEPENDENT_AMBULATORY_CARE_PROVIDER_SITE_OTHER): Payer: BC Managed Care – PPO | Admitting: Medical

## 2022-09-28 VITALS — BP 120/76 | HR 54 | Temp 98.1°F | Resp 18 | Ht 69.0 in | Wt 202.8 lb

## 2022-09-28 DIAGNOSIS — R972 Elevated prostate specific antigen [PSA]: Secondary | ICD-10-CM | POA: Diagnosis not present

## 2022-09-28 DIAGNOSIS — Z125 Encounter for screening for malignant neoplasm of prostate: Secondary | ICD-10-CM | POA: Diagnosis not present

## 2022-09-28 DIAGNOSIS — Z0000 Encounter for general adult medical examination without abnormal findings: Secondary | ICD-10-CM

## 2022-09-28 DIAGNOSIS — R739 Hyperglycemia, unspecified: Secondary | ICD-10-CM

## 2022-09-28 LAB — LIPID PANEL
Cholesterol: 192 mg/dL (ref 0–200)
HDL: 56.6 mg/dL (ref >39.00)
LDL Cholesterol: 121 mg/dL — ABNORMAL HIGH (ref 0–99)
NonHDL: 135.35
Total CHOL/HDL Ratio: 3
Triglycerides: 73 mg/dL (ref 0.0–149.0)
VLDL: 14.6 mg/dL (ref 0.0–40.0)

## 2022-09-28 LAB — HEMOGLOBIN A1C: Hgb A1c MFr Bld: 5.4 % (ref 4.6–6.5)

## 2022-09-28 LAB — COMPREHENSIVE METABOLIC PANEL
ALT: 24 U/L (ref 0–53)
AST: 23 U/L (ref 0–37)
Albumin: 4.2 g/dL (ref 3.5–5.2)
Alkaline Phosphatase: 41 U/L (ref 39–117)
BUN: 11 mg/dL (ref 6–23)
CO2: 29 mEq/L (ref 19–32)
Calcium: 9.5 mg/dL (ref 8.4–10.5)
Chloride: 106 mEq/L (ref 96–112)
Creatinine, Ser: 1.11 mg/dL (ref 0.40–1.50)
GFR: 78.03 mL/min (ref >60.00)
Glucose, Bld: 87 mg/dL (ref 70–99)
Potassium: 4.8 mEq/L (ref 3.5–5.1)
Sodium: 142 mEq/L (ref 135–145)
Total Bilirubin: 0.8 mg/dL (ref 0.2–1.2)
Total Protein: 6.9 g/dL (ref 6.0–8.3)

## 2022-09-28 LAB — CBC WITH DIFFERENTIAL/PLATELET
Basophils Absolute: 0 10*3/uL (ref 0.0–0.1)
Basophils Relative: 0.9 % (ref 0.0–3.0)
Eosinophils Absolute: 0.1 10*3/uL (ref 0.0–0.7)
Eosinophils Relative: 4.1 % (ref 0.0–5.0)
HCT: 43 % (ref 39.0–52.0)
Hemoglobin: 14.4 g/dL (ref 13.0–17.0)
Lymphocytes Relative: 44.8 % (ref 12.0–46.0)
Lymphs Abs: 1.4 10*3/uL (ref 0.7–4.0)
MCHC: 33.5 g/dL (ref 30.0–36.0)
MCV: 95.9 fl (ref 78.0–100.0)
Monocytes Absolute: 0.4 10*3/uL (ref 0.1–1.0)
Monocytes Relative: 11.5 % (ref 3.0–12.0)
Neutro Abs: 1.2 10*3/uL — ABNORMAL LOW (ref 1.4–7.7)
Neutrophils Relative %: 38.7 % — ABNORMAL LOW (ref 43.0–77.0)
Platelets: 255 10*3/uL (ref 150.0–400.0)
RBC: 4.49 Mil/uL (ref 4.22–5.81)
RDW: 14 % (ref 11.5–15.5)
WBC: 3.2 10*3/uL — ABNORMAL LOW (ref 4.0–10.5)

## 2022-09-28 LAB — PSA: PSA: 2.24 ng/mL (ref 0.10–4.00)

## 2022-09-28 NOTE — Patient Instructions (Addendum)
For you wellness exam today I have ordered cbc, cmp, psa and lipid panel.  Vaccine given today.   Up to date on colonoscopy.  Recommend exercise and healthy diet.  We will let you know lab results as they come in.  Follow up date appointment will be determined after lab review.    Preventive Care 78-49 Years Old, Male Preventive care refers to lifestyle choices and visits with your health care provider that can promote health and wellness. Preventive care visits are also called wellness exams. What can I expect for my preventive care visit? Counseling During your preventive care visit, your health care provider may ask about your: Medical history, including: Past medical problems. Family medical history. Current health, including: Emotional well-being. Home life and relationship well-being. Sexual activity. Lifestyle, including: Alcohol, nicotine or tobacco, and drug use. Access to firearms. Diet, exercise, and sleep habits. Safety issues such as seatbelt and bike helmet use. Sunscreen use. Work and work Astronomer. Physical exam Your health care provider will check your: Height and weight. These may be used to calculate your BMI (body mass index). BMI is a measurement that tells if you are at a healthy weight. Waist circumference. This measures the distance around your waistline. This measurement also tells if you are at a healthy weight and may help predict your risk of certain diseases, such as type 2 diabetes and high blood pressure. Heart rate and blood pressure. Body temperature. Skin for abnormal spots. What immunizations do I need?  Vaccines are usually given at various ages, according to a schedule. Your health care provider will recommend vaccines for you based on your age, medical history, and lifestyle or other factors, such as travel or where you work. What tests do I need? Screening Your health care provider may recommend screening tests for certain  conditions. This may include: Lipid and cholesterol levels. Diabetes screening. This is done by checking your blood sugar (glucose) after you have not eaten for a while (fasting). Hepatitis B test. Hepatitis C test. HIV (human immunodeficiency virus) test. STI (sexually transmitted infection) testing, if you are at risk. Lung cancer screening. Prostate cancer screening. Colorectal cancer screening. Talk with your health care provider about your test results, treatment options, and if necessary, the need for more tests. Follow these instructions at home: Eating and drinking  Eat a diet that includes fresh fruits and vegetables, whole grains, lean protein, and low-fat dairy products. Take vitamin and mineral supplements as recommended by your health care provider. Do not drink alcohol if your health care provider tells you not to drink. If you drink alcohol: Limit how much you have to 0-2 drinks a day. Know how much alcohol is in your drink. In the [place] one drink equals one 12 oz bottle of beer (355 mL), one 5 oz glass of wine (148 mL), or one 1 oz glass of hard liquor (44 mL). Lifestyle Brush your teeth every morning and night with fluoride toothpaste. Floss one time each day. Exercise for at least 30 minutes 5 or more days each week. Do not use any products that contain nicotine or tobacco. These products include cigarettes, chewing tobacco, and vaping devices, such as e-cigarettes. If you need help quitting, ask your health care provider. Do not use drugs. If you are sexually active, practice safe sex. Use a condom or other form of protection to prevent STIs. Take aspirin only as told by your health care provider. Make sure that you understand how much to take and what  form to take. Work with your health care provider to find out whether it is safe and beneficial for you to take aspirin daily. Find healthy ways to manage stress, such as: Meditation, yoga, or listening to  music. Journaling. Talking to a trusted person. Spending time with friends and family. Minimize exposure to UV radiation to reduce your risk of skin cancer. Safety Always wear your seat belt while driving or riding in a vehicle. Do not drive: If you have been drinking alcohol. Do not ride with someone who has been drinking. When you are tired or distracted. While texting. If you have been using any mind-altering substances or drugs. Wear a helmet and other protective equipment during sports activities. If you have firearms in your house, make sure you follow all gun safety procedures. What's next? Go to your health care provider once a year for an annual wellness visit. Ask your health care provider how often you should have your eyes and teeth checked. Stay up to date on all vaccines. This information is not intended to replace advice given to you by your health care provider. Make sure you discuss any questions you have with your health care provider. Document Revised: 10/12/2020 Document Reviewed: 10/12/2020 Elsevier Patient Education  2024 [employer].

## 2022-09-28 NOTE — Progress Notes (Signed)
Subjective:    Patient ID: Ray Lopez, male    DOB: 1975/08/09, 47 y.o.   MRN: [id number]  HPI  Pt in for cpe/wellness.   Pt states work busy. Pt has been exercising 1 day  a week. Stationary bike. Pt states he is eating healthy. Nonsmoker. Alcohol on weekends. 1-2  glasses of wine. 2 cups of coffee a day.   Vaccines appear up to date.  Sugar level mild elevated.  U[ to date on colonoscopy.  Review of Systems  Constitutional:  Negative for chills, fatigue and fever.  HENT:  Negative for congestion, ear discharge and ear pain.   Respiratory:  Negative for chest tightness, shortness of breath and wheezing.   Cardiovascular:  Negative for chest pain and palpitations.  Gastrointestinal:  Negative for abdominal distention, abdominal pain, blood in stool, constipation, diarrhea and nausea.  Genitourinary:  Negative for dysuria.  Musculoskeletal:  Negative for back pain.  Skin:  Negative for rash.  Neurological:  Negative for dizziness, seizures, syncope, weakness and headaches.  Hematological:  Negative for adenopathy. Does not bruise/bleed easily.  Psychiatric/Behavioral:  Negative for behavioral problems and confusion.     Past Medical History:  Diagnosis Date   Allergic rhinitis    Asthma    Blood pressure elevated without history of HTN    Chicken pox    Genital herpes    Low testosterone      Social History   Socioeconomic History   Marital status: Married    Spouse name: Not on file   Number of children: Not on file   Years of education: Not on file   Highest education level: Not on file  Occupational History   Not on file  Tobacco Use   Smoking status: Never   Smokeless tobacco: Never  Vaping Use   Vaping Use: Never used  Substance and Sexual Activity   Alcohol use: Yes    Alcohol/week: 6.0 standard drinks of alcohol    Types: 6 Cans of beer per week   Drug use: No   Sexual activity: Yes    Birth control/protection: Surgical  Other Topics  Concern   Not on file  Social History Medical sales representative for [employer]   Married over 12 years   28 yr old daughter         Social Determinants of Health   Financial Resource Strain: Not on file  Food Insecurity: Not on file  Transportation Needs: Not on file  Physical Activity: Not on file  Stress: Not on file  Social Connections: Not on file  Intimate Partner Violence: Not on file    Past Surgical History:  Procedure Laterality Date   REFRACTIVE SURGERY  4/13   lasix eye surgery   VASECTOMY  10/2010    Family History  Problem Relation Age of Onset   Hypertension Mother        Living   Hyperlipidemia Mother    Diverticulitis Mother    Healthy Father        Living   Healthy Sister    Diabetes Brother    Diabetes Maternal Grandmother    Allergies Daughter    Coronary artery disease Neg Hx    Stroke Neg Hx    Breast cancer Neg Hx    Colon cancer Neg Hx    Prostate cancer Neg Hx    Esophageal cancer Neg Hx    Stomach cancer Neg Hx    Rectal cancer Neg Hx  Allergies  Allergen Reactions   Shellfish Allergy Anaphylaxis    Current Outpatient Medications on File Prior to Visit  Medication Sig Dispense Refill   albuterol (VENTOLIN HFA) 108 (90 Base) MCG/ACT inhaler Inhale 1-2 puffs into the lungs every 6 (six) hours as needed for wheezing or shortness of breath. 8 g 0   cyclobenzaprine (FLEXERIL) 10 MG tablet 1 tab po q hs prn muscle spasm/back pain 5 tablet 0   Multiple Vitamins-Minerals (MULTIVITAMIN PO) Take by mouth daily. [employer] MENS MVI     valACYclovir HCl (VALTREX PO) Take by mouth daily as needed.     No current facility-administered medications on file prior to visit.    BP 120/76   Pulse (!) 54   Temp 98.1 F (36.7 C)   Resp 18   Ht 5\' 9"  (1.753 m)   Wt 202 lb 12.8 oz (92 kg)   SpO2 100%   BMI 29.95 kg/m        Objective:   Physical Exam  General Mental Status- Alert. General Appearance- Not in acute distress.   Skin General:  Color- Normal Color. Moisture- Normal Moisture.  Neck Carotid Arteries- Normal color. Moisture- Normal Moisture. No carotid bruits. No JVD.  Chest and Lung Exam Auscultation: Breath Sounds:-Normal.  Cardiovascular Auscultation:Rythm- Regular. Murmurs & Other Heart Sounds:Auscultation of the heart reveals- No Murmurs.  Abdomen Inspection:-Inspeection Normal. Palpation/Percussion:Note:No mass. Palpation and Percussion of the abdomen reveal- Non Tender, Non Distended + BS, no rebound or guarding.   Neurologic Cranial Nerve exam:- CN III-XII intact(No nystagmus), symmetric smile. Strength:- 5/5 equal and symmetric strength both upper and lower extremities.       Assessment & Plan:   Patient Instructions  For you wellness exam today I have ordered cbc, cmp, tsh and lipid panel.  Vaccine given today.   Recommend exercise and healthy diet.  We will let you know lab results as they come in.  Follow up date appointment will be determined after lab review.      Esperanza Richters, PA-C

## 2022-09-29 NOTE — Addendum Note (Signed)
Addended by: Gwenevere Abbot on: 09/29/2022 06:30 PM   Modules accepted: Orders

## 2022-10-30 ENCOUNTER — Other Ambulatory Visit (INDEPENDENT_AMBULATORY_CARE_PROVIDER_SITE_OTHER): Payer: BC Managed Care – PPO

## 2022-10-30 DIAGNOSIS — R972 Elevated prostate specific antigen [PSA]: Secondary | ICD-10-CM

## 2022-10-30 LAB — PSA: PSA: 1.7 ng/mL (ref 0.10–4.00)

## 2023-09-30 ENCOUNTER — Ambulatory Visit (INDEPENDENT_AMBULATORY_CARE_PROVIDER_SITE_OTHER): Admitting: Medical

## 2023-09-30 VITALS — BP 112/88 | HR 74 | Resp 18 | Ht 69.0 in | Wt 215.8 lb

## 2023-09-30 DIAGNOSIS — Z125 Encounter for screening for malignant neoplasm of prostate: Secondary | ICD-10-CM

## 2023-09-30 DIAGNOSIS — Z0000 Encounter for general adult medical examination without abnormal findings: Secondary | ICD-10-CM | POA: Diagnosis not present

## 2023-09-30 DIAGNOSIS — R739 Hyperglycemia, unspecified: Secondary | ICD-10-CM | POA: Diagnosis not present

## 2023-09-30 DIAGNOSIS — D179 Benign lipomatous neoplasm, unspecified: Secondary | ICD-10-CM | POA: Diagnosis not present

## 2023-09-30 DIAGNOSIS — Z0001 Encounter for general adult medical examination with abnormal findings: Secondary | ICD-10-CM

## 2023-09-30 DIAGNOSIS — D72819 Decreased white blood cell count, unspecified: Secondary | ICD-10-CM

## 2023-09-30 LAB — LIPID PANEL
Cholesterol: 217 mg/dL — ABNORMAL HIGH (ref 0–200)
HDL: 62.9 mg/dL (ref >39.00)
LDL Cholesterol: 134 mg/dL — ABNORMAL HIGH (ref 0–99)
NonHDL: 154.02
Total CHOL/HDL Ratio: 3
Triglycerides: 101 mg/dL (ref 0.0–149.0)
VLDL: 20.2 mg/dL (ref 0.0–40.0)

## 2023-09-30 LAB — CBC WITH DIFFERENTIAL/PLATELET
Basophils Absolute: 0 10*3/uL (ref 0.0–0.1)
Basophils Relative: 1 % (ref 0.0–3.0)
Eosinophils Absolute: 0.1 10*3/uL (ref 0.0–0.7)
Eosinophils Relative: 3.5 % (ref 0.0–5.0)
HCT: 45 % (ref 39.0–52.0)
Hemoglobin: 15.2 g/dL (ref 13.0–17.0)
Lymphocytes Relative: 48.1 % — ABNORMAL HIGH (ref 12.0–46.0)
Lymphs Abs: 1.4 10*3/uL (ref 0.7–4.0)
MCHC: 33.8 g/dL (ref 30.0–36.0)
MCV: 95.5 fl (ref 78.0–100.0)
Monocytes Absolute: 0.3 10*3/uL (ref 0.1–1.0)
Monocytes Relative: 12.1 % — ABNORMAL HIGH (ref 3.0–12.0)
Neutro Abs: 1 10*3/uL — ABNORMAL LOW (ref 1.4–7.7)
Neutrophils Relative %: 35.3 % — ABNORMAL LOW (ref 43.0–77.0)
Platelets: 227 10*3/uL (ref 150.0–400.0)
RBC: 4.71 Mil/uL (ref 4.22–5.81)
RDW: 14.5 % (ref 11.5–15.5)
WBC: 2.8 10*3/uL — ABNORMAL LOW (ref 4.0–10.5)

## 2023-09-30 LAB — COMPREHENSIVE METABOLIC PANEL WITH GFR
ALT: 31 U/L (ref 0–53)
AST: 26 U/L (ref 0–37)
Albumin: 4.6 g/dL (ref 3.5–5.2)
Alkaline Phosphatase: 38 U/L — ABNORMAL LOW (ref 39–117)
BUN: 9 mg/dL (ref 6–23)
CO2: 30 meq/L (ref 19–32)
Calcium: 9.6 mg/dL (ref 8.4–10.5)
Chloride: 104 meq/L (ref 96–112)
Creatinine, Ser: 1.06 mg/dL (ref 0.40–1.50)
GFR: 81.89 mL/min (ref >60.00)
Glucose, Bld: 90 mg/dL (ref 70–99)
Potassium: 4.3 meq/L (ref 3.5–5.1)
Sodium: 140 meq/L (ref 135–145)
Total Bilirubin: 0.8 mg/dL (ref 0.2–1.2)
Total Protein: 7.1 g/dL (ref 6.0–8.3)

## 2023-09-30 LAB — HEMOGLOBIN A1C: Hgb A1c MFr Bld: 5.7 % (ref 4.6–6.5)

## 2023-09-30 LAB — PSA: PSA: 1.52 ng/mL (ref 0.10–4.00)

## 2023-09-30 NOTE — Patient Instructions (Addendum)
 For you wellness exam today I have ordered cbc, cmp, A1-C, psa and lipid panel.  Pneumonia vaccine declined. Also offered shingles. Pt declined but will research shingrix vaccine then decide.   Recommend exercise and healthy diet.  We will let you know lab results as they come in.  Follow up date appointment will be determined after lab review.    Discussed differential dx of small pea sized lump left trapezius area. Lipoma vs sebaceous cyst. I think lipoma. Referred to general surgeon for opinion.    Preventive Care 17-37 Years Old, Male Preventive care refers to lifestyle choices and visits with your health care provider that can promote health and wellness. Preventive care visits are also called wellness exams. What can I expect for my preventive care visit? Counseling During your preventive care visit, your health care provider may ask about your: Medical history, including: Past medical problems. Family medical history. Current health, including: Emotional well-being. Home life and relationship well-being. Sexual activity. Lifestyle, including: Alcohol, nicotine or tobacco, and drug use. Access to firearms. Diet, exercise, and sleep habits. Safety issues such as seatbelt and bike helmet use. Sunscreen use. Work and work Astronomer. Physical exam Your health care provider will check your: Height and weight. These may be used to calculate your BMI (body mass index). BMI is a measurement that tells if you are at a healthy weight. Waist circumference. This measures the distance around your waistline. This measurement also tells if you are at a healthy weight and may help predict your risk of certain diseases, such as type 2 diabetes and high blood pressure. Heart rate and blood pressure. Body temperature. Skin for abnormal spots. What immunizations do I need?  Vaccines are usually given at various ages, according to a schedule. Your health care provider will recommend  vaccines for you based on your age, medical history, and lifestyle or other factors, such as travel or where you work. What tests do I need? Screening Your health care provider may recommend screening tests for certain conditions. This may include: Lipid and cholesterol levels. Diabetes screening. This is done by checking your blood sugar (glucose) after you have not eaten for a while (fasting). Hepatitis B test. Hepatitis C test. HIV (human immunodeficiency virus) test. STI (sexually transmitted infection) testing, if you are at risk. Lung cancer screening. Prostate cancer screening. Colorectal cancer screening. Talk with your health care provider about your test results, treatment options, and if necessary, the need for more tests. Follow these instructions at home: Eating and drinking  Eat a diet that includes fresh fruits and vegetables, whole grains, lean protein, and low-fat dairy products. Take vitamin and mineral supplements as recommended by your health care provider. Do not drink alcohol if your health care provider tells you not to drink. If you drink alcohol: Limit how much you have to 0-2 drinks a day. Know how much alcohol is in your drink. In the [place] one drink equals one 12 oz bottle of beer (355 mL), one 5 oz glass of wine (148 mL), or one 1 oz glass of hard liquor (44 mL). Lifestyle Brush your teeth every morning and night with fluoride toothpaste. Floss one time each day. Exercise for at least 30 minutes 5 or more days each week. Do not use any products that contain nicotine or tobacco. These products include cigarettes, chewing tobacco, and vaping devices, such as e-cigarettes. If you need help quitting, ask your health care provider. Do not use drugs. If you are sexually active, practice  safe sex. Use a condom or other form of protection to prevent STIs. Take aspirin only as told by your health care provider. Make sure that you understand how much to take and what  form to take. Work with your health care provider to find out whether it is safe and beneficial for you to take aspirin daily. Find healthy ways to manage stress, such as: Meditation, yoga, or listening to music. Journaling. Talking to a trusted person. Spending time with friends and family. Minimize exposure to UV radiation to reduce your risk of skin cancer. Safety Always wear your seat belt while driving or riding in a vehicle. Do not drive: If you have been drinking alcohol. Do not ride with someone who has been drinking. When you are tired or distracted. While texting. If you have been using any mind-altering substances or drugs. Wear a helmet and other protective equipment during sports activities. If you have firearms in your house, make sure you follow all gun safety procedures. What's next? Go to your health care provider once a year for an annual wellness visit. Ask your health care provider how often you should have your eyes and teeth checked. Stay up to date on all vaccines. This information is not intended to replace advice given to you by your health care provider. Make sure you discuss any questions you have with your health care provider. Document Revised: 10/12/2020 Document Reviewed: 10/12/2020 Elsevier Patient Education  2024 [employer].

## 2023-09-30 NOTE — Progress Notes (Signed)
 Subjective:    Patient ID: Ray Lopez, male    DOB: Jun 09, 1975, 48 y.o.   MRN: [id number]  HPI  Pt in for cpe/wellness.(Pt is fasting)   Pt states work busy. Pt has been exercising twice a week. Stationary bike. Pt states he is eating healthy. Nonsmoker. Alcohol on weekends. 1-2  shots of liquor on weekends. 2 cups of coffee a day.     Vaccines appear up to date.   Sugar level mild elevated in the past   Up to date on colonoscopy(done in 2022).    Review of Systems  Constitutional:  Negative for chills, fatigue and fever.  HENT:  Negative for congestion, ear discharge and ear pain.   Respiratory:  Negative for chest tightness, shortness of breath and wheezing.   Cardiovascular:  Negative for chest pain and palpitations.  Gastrointestinal:  Negative for abdominal distention, abdominal pain, blood in stool, constipation, diarrhea and nausea.  Genitourinary:  Negative for dysuria.  Musculoskeletal:  Negative for back pain.  Skin:  Negative for rash.       Left lower trap area. Pt states wife has felt small bump in area for more than 5 years. No change in size. Area does not hurt.   Neurological:  Negative for dizziness, seizures, syncope, weakness and headaches.  Hematological:  Negative for adenopathy. Does not bruise/bleed easily.  Psychiatric/Behavioral:  Negative for behavioral problems and confusion.     Past Medical History:  Diagnosis Date   Allergic rhinitis    Asthma    Blood pressure elevated without history of HTN    Chicken pox    Genital herpes    Low testosterone       Social History   Socioeconomic History   Marital status: Married    Spouse name: Not on file   Number of children: Not on file   Years of education: Not on file   Highest education level: Not on file  Occupational History   Not on file  Tobacco Use   Smoking status: Never   Smokeless tobacco: Never  Vaping Use   Vaping status: Never Used  Substance and Sexual Activity    Alcohol use: Yes    Alcohol/week: 6.0 standard drinks of alcohol    Types: 6 Cans of beer per week   Drug use: No   Sexual activity: Yes    Birth control/protection: Surgical  Other Topics Concern   Not on file  Social History Medical sales representative for [employer]   Married over 12 years   58 yr old daughter         Social Drivers of Corporate investment banker Strain: Not on file  Food Insecurity: Not on file  Transportation Needs: Not on file  Physical Activity: Not on file  Stress: Not on file  Social Connections: Not on file  Intimate Partner Violence: Not on file    Past Surgical History:  Procedure Laterality Date   REFRACTIVE SURGERY  4/13   lasix eye surgery   VASECTOMY  10/2010    Family History  Problem Relation Age of Onset   Hypertension Mother        Living   Hyperlipidemia Mother    Diverticulitis Mother    Healthy Father        Living   Healthy Sister    Diabetes Brother    Diabetes Maternal Grandmother    Allergies Daughter    Coronary artery disease Neg Hx    Stroke  Neg Hx    Breast cancer Neg Hx    Colon cancer Neg Hx    Prostate cancer Neg Hx    Esophageal cancer Neg Hx    Stomach cancer Neg Hx    Rectal cancer Neg Hx     Allergies  Allergen Reactions   Shellfish Allergy Anaphylaxis    Current Outpatient Medications on File Prior to Visit  Medication Sig Dispense Refill   albuterol  (VENTOLIN  HFA) 108 (90 Base) MCG/ACT inhaler Inhale 1-2 puffs into the lungs every 6 (six) hours as needed for wheezing or shortness of breath. 8 g 0   cyclobenzaprine  (FLEXERIL ) 10 MG tablet 1 tab po q hs prn muscle spasm/back pain 5 tablet 0   Multiple Vitamins-Minerals (MULTIVITAMIN PO) Take by mouth daily. [employer] MENS MVI     valACYclovir  HCl (VALTREX  PO) Take by mouth daily as needed.     No current facility-administered medications on file prior to visit.    BP 112/88   Pulse 74   Resp 18   Ht 5\' 9"  (1.753 m)   Wt 215 lb 12.8 oz (97.9 kg)   SpO2  100%   BMI 31.87 kg/m        Objective:   Physical Exam  General Mental Status- Alert. General Appearance- Not in acute distress.   Skin General: Color- Normal Color. Moisture- Normal Moisture.  Neck Carotid Arteries- Normal color. Moisture- Normal Moisture. No carotid bruits. No JVD.  Chest and Lung Exam Auscultation: Breath Sounds:-CTA  Cardiovascular Auscultation:Rythm- RRR Murmurs & Other Heart Sounds:Auscultation of the heart reveals- No Murmurs.  Abdomen Inspection:-Inspeection Normal. Palpation/Percussion:Note:No mass. Palpation and Percussion of the abdomen reveal- Non Tender, Non Distended + BS, no rebound or guarding.   Neurologic Cranial Nerve exam:- CN III-XII intact(No nystagmus), symmetric smile. Finger to Nose:- Normal/Intact equal and symmetric strength both upper and lower extremities.   Skin- left lower trap area. Small pea sized bump that feels like a lipoma vs sebaceous cyst.      Assessment & Plan:   For you wellness exam today I have ordered cbc, cmp, A1-C psa and lipid panel.  Pneumonia vaccine declined. Also offered shingles. Pt declined but will research shingrix vaccine then decide.   Recommend exercise and healthy diet.  We will let you know lab results as they come in.  Follow up date appointment will be determined after lab review.   Discussed differential dx of small pea sized lump left trapezius area. Lipoma vs sebaceous cyst. I think lipoma. Referred to general surgeon for opinion.    Sylvia Everts, PA-C   [id number] charge as did give address lump and did place referral for probable lipoma

## 2023-10-01 ENCOUNTER — Ambulatory Visit: Payer: Self-pay | Admitting: Medical

## 2023-10-01 NOTE — Addendum Note (Signed)
 Addended by: Serafina Damme on: 10/01/2023 11:52 AM   Modules accepted: Orders

## 2023-10-11 ENCOUNTER — Encounter: Payer: Self-pay | Admitting: Family

## 2023-10-11 ENCOUNTER — Inpatient Hospital Stay: Attending: Hematology & Oncology

## 2023-10-11 ENCOUNTER — Inpatient Hospital Stay (HOSPITAL_BASED_OUTPATIENT_CLINIC_OR_DEPARTMENT_OTHER): Admitting: Family

## 2023-10-11 ENCOUNTER — Other Ambulatory Visit: Payer: Self-pay | Admitting: Family

## 2023-10-11 VITALS — BP 127/89 | HR 71 | Temp 98.4°F | Resp 18 | Ht 69.0 in | Wt 215.0 lb

## 2023-10-11 DIAGNOSIS — D72819 Decreased white blood cell count, unspecified: Secondary | ICD-10-CM | POA: Diagnosis present

## 2023-10-11 LAB — CMP (CANCER CENTER ONLY)
ALT: 25 U/L (ref 0–44)
AST: 21 U/L (ref 15–41)
Albumin: 4.4 g/dL (ref 3.5–5.0)
Alkaline Phosphatase: 33 U/L — ABNORMAL LOW (ref 38–126)
Anion gap: 5 (ref 5–15)
BUN: 8 mg/dL (ref 6–20)
CO2: 29 mmol/L (ref 22–32)
Calcium: 9.2 mg/dL (ref 8.9–10.3)
Chloride: 105 mmol/L (ref 98–111)
Creatinine: 1.21 mg/dL (ref 0.61–1.24)
GFR, Estimated: >60 mL/min (ref >60)
Glucose, Bld: 94 mg/dL (ref 70–99)
Potassium: 4.4 mmol/L (ref 3.5–5.1)
Sodium: 139 mmol/L (ref 135–145)
Total Bilirubin: 0.6 mg/dL (ref 0.0–1.2)
Total Protein: 7 g/dL (ref 6.5–8.1)

## 2023-10-11 LAB — CBC WITH DIFFERENTIAL (CANCER CENTER ONLY)
Abs Immature Granulocytes: 0 10*3/uL (ref 0.00–0.07)
Basophils Absolute: 0 10*3/uL (ref 0.0–0.1)
Basophils Relative: 1 %
Eosinophils Absolute: 0.1 10*3/uL (ref 0.0–0.5)
Eosinophils Relative: 3 %
HCT: 42.3 % (ref 39.0–52.0)
Hemoglobin: 14.6 g/dL (ref 13.0–17.0)
Immature Granulocytes: 0 %
Lymphocytes Relative: 41 %
Lymphs Abs: 1.4 10*3/uL (ref 0.7–4.0)
MCH: 32.3 pg (ref 26.0–34.0)
MCHC: 34.5 g/dL (ref 30.0–36.0)
MCV: 93.6 fL (ref 80.0–100.0)
Monocytes Absolute: 0.3 10*3/uL (ref 0.1–1.0)
Monocytes Relative: 10 %
Neutro Abs: 1.6 10*3/uL — ABNORMAL LOW (ref 1.7–7.7)
Neutrophils Relative %: 45 %
Platelet Count: 236 10*3/uL (ref 150–400)
RBC: 4.52 MIL/uL (ref 4.22–5.81)
RDW: 13.4 % (ref 11.5–15.5)
WBC Count: 3.5 10*3/uL — ABNORMAL LOW (ref 4.0–10.5)
nRBC: 0 % (ref 0.0–0.2)

## 2023-10-11 LAB — SAVE SMEAR(SSMR), FOR PROVIDER SLIDE REVIEW

## 2023-10-11 LAB — LACTATE DEHYDROGENASE: LDH: 118 U/L (ref 98–192)

## 2023-10-11 NOTE — Progress Notes (Signed)
 Hematology/Oncology Consultation   Name: Ray Lopez      MRN: [id number]    Location: Room/bed info not found  Date: 10/11/2023 Time:9:22 AM   REFERRING PHYSICIAN:  Sylvia Everts, PA-C  REASON FOR CONSULT:  Leukopenia   DIAGNOSIS: Mild leukopenia  HISTORY OF PRESENT ILLNESS:  Ray Lopez is a very pleasant 48 yo African American gentleman with over a 15 year history of mild leukopenia.  WBC count is stable at 3.5, neutrophils 45% and lymphocytes 41%.  He remains asymptomatic and has no complaints at this time.  No adenopathy or lymphedema noted on exam. No organomegaly on palpation of abdomen.  He does have what feels like a left trapezius lipoma that is mobile and not painful to the touch. He has been referred to a surgeon for this.  No past history of surgery.  No hot flashes or night sweats.  No frequent or recurrent infections.  No fever, chills, n/v, cough, rash, dizziness, headaches, SOB, chest pain, palpitations, abdominal pain/bloating, or changes in bowel or bladder habits.  He had his colonoscopy in 11/2020. He had 4 benign polyps removed and was noted to have diverticulosis throughout the entire colon.  No personal or known familial history of cancer.  No history of diabetes or thyroid  disease.  He is very active and enjoys riding his stationary bike and doing yoga.  He denies fatigue or trouble sleeping.  No swelling, tenderness, numbness or tingling in his extremities.  No falls or syncope reported.  Appetite and hydration are good. Weight is described as stable at 215 lbs.  No smoking or recreational drug use. He will have 2-3 whiskies on the weekend.  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   ROS: All other 10 point review of systems is negative.   PAST MEDICAL HISTORY:   Past Medical History:  Diagnosis Date   Allergic rhinitis    Asthma    Blood pressure elevated without history of HTN    Chicken pox    Genital herpes    Low  testosterone      ALLERGIES: Allergies  Allergen Reactions   Shellfish Allergy Anaphylaxis      MEDICATIONS:  Current Outpatient Medications on File Prior to Visit  Medication Sig Dispense Refill   albuterol  (VENTOLIN  HFA) 108 (90 Base) MCG/ACT inhaler Inhale 1-2 puffs into the lungs every 6 (six) hours as needed for wheezing or shortness of breath. 8 g 0   Multiple Vitamins-Minerals (MULTIVITAMIN PO) Take by mouth daily. [employer] MENS MVI     valACYclovir  HCl (VALTREX  PO) Take by mouth daily as needed.     cyclobenzaprine  (FLEXERIL ) 10 MG tablet 1 tab po q hs prn muscle spasm/back pain (Patient not taking: Reported on 10/11/2023) 5 tablet 0   No current facility-administered medications on file prior to visit.     PAST SURGICAL HISTORY Past Surgical History:  Procedure Laterality Date   REFRACTIVE SURGERY  4/13   lasix eye surgery   VASECTOMY  10/2010    FAMILY HISTORY: Family History  Problem Relation Age of Onset   Hypertension Mother        Living   Hyperlipidemia Mother    Diverticulitis Mother    Healthy Father        Living   Healthy Sister    Diabetes Brother    Diabetes Maternal Grandmother    Allergies Daughter    Coronary artery disease Neg Hx    Stroke Neg Hx    Breast cancer  Neg Hx    Colon cancer Neg Hx    Prostate cancer Neg Hx    Esophageal cancer Neg Hx    Stomach cancer Neg Hx    Rectal cancer Neg Hx     SOCIAL HISTORY:  reports that he has never smoked. He has never used smokeless tobacco. He reports current alcohol use of about 6.0 standard drinks of alcohol per week. He reports that he does not use drugs.  PERFORMANCE STATUS: The patient's performance status is 0 - Asymptomatic  PHYSICAL EXAM: Most Recent Vital Signs: Blood pressure 127/89, pulse 71, temperature 98.4 F (36.9 C), temperature source Oral, resp. rate 18, height 5' 9 (1.753 m), weight 215 lb (97.5 kg), SpO2 100%. BP 127/89 (BP Location: Left Arm, Patient Position: Sitting)    Pulse 71   Temp 98.4 F (36.9 C) (Oral)   Resp 18   Ht 5' 9 (1.753 m)   Wt 215 lb (97.5 kg)   SpO2 100%   BMI 31.75 kg/m   General Appearance:    Alert, cooperative, no distress, appears stated age  Head:    Normocephalic, without obvious abnormality, atraumatic  Eyes:    PERRL, conjunctiva/corneas clear, EOM's intact, fundi    benign, both eyes             Throat:   Lips, mucosa, and tongue normal; teeth and gums normal  Neck:   Supple, symmetrical, trachea midline, no adenopathy;       thyroid :  No enlargement/tenderness/nodules; no carotid   bruit or JVD  Back:     Symmetric, no curvature, ROM normal, no CVA tenderness  Lungs:     Clear to auscultation bilaterally, respirations unlabored  Chest wall:    No tenderness or deformity  Heart:    Regular rate and rhythm, S1 and S2 normal, no murmur, rub   or gallop  Abdomen:     Soft, non-tender, bowel sounds active all four quadrants,    no masses, no organomegaly        Extremities:   Extremities normal, atraumatic, no cyanosis or edema  Pulses:   2+ and symmetric all extremities  Skin:   Skin color, texture, turgor normal, no rashes or lesions  Lymph nodes:   Cervical, supraclavicular, and axillary nodes normal  Neurologic:   CNII-XII intact. Normal strength, sensation and reflexes      throughout    LABORATORY DATA:  Results for orders placed or performed in visit on 10/11/23 (from the past 48 hours)  CBC with Differential (Cancer Center Only)     Status: Abnormal   Collection Time: 10/11/23  8:48 AM  Result Value Ref Range   WBC Count 3.5 (L) 4.0 - 10.5 K/uL   RBC 4.52 4.22 - 5.81 MIL/uL   Hemoglobin 14.6 13.0 - 17.0 g/dL   HCT 82.9 56.2 - 13.0 %   MCV 93.6 80.0 - 100.0 fL   MCH 32.3 26.0 - 34.0 pg   MCHC 34.5 30.0 - 36.0 g/dL   RDW 86.5 78.4 - 69.6 %   Platelet Count 236 150 - 400 K/uL   nRBC 0.0 0.0 - 0.2 %   Neutrophils Relative % 45 %   Neutro Abs 1.6 (L) 1.7 - 7.7 K/uL   Lymphocytes Relative 41 %   Lymphs  Abs 1.4 0.7 - 4.0 K/uL   Monocytes Relative 10 %   Monocytes Absolute 0.3 0.1 - 1.0 K/uL   Eosinophils Relative 3 %   Eosinophils Absolute 0.1 0.0 -  0.5 K/uL   Basophils Relative 1 %   Basophils Absolute 0.0 0.0 - 0.1 K/uL   Immature Granulocytes 0 %   Abs Immature Granulocytes 0.00 0.00 - 0.07 K/uL    Comment: Performed at [facility], [address] [place], [place] [address]  CMP (Cancer Center only)     Status: Abnormal   Collection Time: 10/11/23  8:48 AM  Result Value Ref Range   Sodium 139 135 - 145 mmol/L   Potassium 4.4 3.5 - 5.1 mmol/L   Chloride 105 98 - 111 mmol/L   CO2 29 22 - 32 mmol/L   Glucose, Bld 94 70 - 99 mg/dL    Comment: Glucose reference range applies only to samples taken after fasting for at least 8 hours.   BUN 8 6 - 20 mg/dL   Creatinine 6.29 5.28 - 1.24 mg/dL   Calcium  9.2 8.9 - 10.3 mg/dL   Total Protein 7.0 6.5 - 8.1 g/dL   Albumin 4.4 3.5 - 5.0 g/dL   AST 21 15 - 41 U/L   ALT 25 0 - 44 U/L   Alkaline Phosphatase 33 (L) 38 - 126 U/L   Total Bilirubin 0.6 0.0 - 1.2 mg/dL   GFR, Estimated >41 >32 mL/min    Comment: (NOTE) Calculated using the CKD-EPI Creatinine Equation (2021)    Anion gap 5 5 - 15    Comment: Performed at [facility], [address] [place], [place] [address]  Save Smear for Provider Slide Review     Status: None   Collection Time: 10/11/23  8:48 AM  Result Value Ref Range   Smear Review SMEAR STAINED AND AVAILABLE FOR REVIEW     Comment: Performed at [facility], [address] [place], [place] [address]      RADIOGRAPHY: No results found.     PATHOLOGY: None  ASSESSMENT/PLAN: Mr. 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  CBC with diff and blood smear reviewed in detail with Ray Lopez. No abnormality or evidence of malignancy noted.  No intervention or follow-up  indicated.    All questions were answered. The patient knows to call the clinic with any problems, questions or concerns. We can certainly see the patient again for any future heme.onc issue that may occur.   The patient was discussed with Ray Lopez and he is in agreement with the aforementioned.   Ray Pea, Ray Lopez
